# Patient Record
Sex: Female | Born: 1975 | Race: White | Hispanic: No | Marital: Married | Smoking: Never smoker
Health system: Southern US, Community
[De-identification: ages and names within clinical notes are randomized; demographics above are authoritative.]

## PROBLEM LIST (undated history)

## (undated) ENCOUNTER — Inpatient Hospital Stay (HOSPITAL_COMMUNITY): Payer: Self-pay

## (undated) DIAGNOSIS — IMO0001 Reserved for inherently not codable concepts without codable children: Secondary | ICD-10-CM

## (undated) DIAGNOSIS — R87629 Unspecified abnormal cytological findings in specimens from vagina: Secondary | ICD-10-CM

## (undated) DIAGNOSIS — Z789 Other specified health status: Secondary | ICD-10-CM

## (undated) DIAGNOSIS — O24419 Gestational diabetes mellitus in pregnancy, unspecified control: Secondary | ICD-10-CM

## (undated) DIAGNOSIS — IMO0002 Reserved for concepts with insufficient information to code with codable children: Secondary | ICD-10-CM

## (undated) DIAGNOSIS — R87619 Unspecified abnormal cytological findings in specimens from cervix uteri: Secondary | ICD-10-CM

## (undated) DIAGNOSIS — B009 Herpesviral infection, unspecified: Secondary | ICD-10-CM

## (undated) DIAGNOSIS — B029 Zoster without complications: Secondary | ICD-10-CM

## (undated) DIAGNOSIS — O894 Spinal and epidural anesthesia-induced headache during the puerperium: Secondary | ICD-10-CM

## (undated) DIAGNOSIS — D62 Acute posthemorrhagic anemia: Secondary | ICD-10-CM

## (undated) HISTORY — DX: Gestational diabetes mellitus in pregnancy, unspecified control: O24.419

## (undated) HISTORY — DX: Unspecified abnormal cytological findings in specimens from cervix uteri: R87.619

## (undated) HISTORY — PX: LEEP: SHX91

## (undated) HISTORY — DX: Reserved for concepts with insufficient information to code with codable children: IMO0002

## (undated) HISTORY — PX: WISDOM TOOTH EXTRACTION: SHX21

## (undated) HISTORY — DX: Zoster without complications: B02.9

## (undated) HISTORY — PX: TUBAL LIGATION: SHX77

## (undated) HISTORY — DX: Herpesviral infection, unspecified: B00.9

## (undated) HISTORY — DX: Unspecified abnormal cytological findings in specimens from vagina: R87.629

---

## 2004-07-01 ENCOUNTER — Encounter: Admission: RE | Admit: 2004-07-01 | Discharge: 2004-09-26 | Payer: Self-pay | Admitting: Anesthesiology

## 2004-07-02 ENCOUNTER — Ambulatory Visit: Payer: Self-pay | Admitting: Anesthesiology

## 2004-07-11 ENCOUNTER — Ambulatory Visit: Payer: Self-pay | Admitting: Physical Medicine & Rehabilitation

## 2004-08-06 ENCOUNTER — Ambulatory Visit: Payer: Self-pay | Admitting: Anesthesiology

## 2004-08-20 ENCOUNTER — Ambulatory Visit: Payer: Self-pay | Admitting: Anesthesiology

## 2012-09-24 HISTORY — PX: OTHER SURGICAL HISTORY: SHX169

## 2013-01-08 DIAGNOSIS — O039 Complete or unspecified spontaneous abortion without complication: Secondary | ICD-10-CM | POA: Insufficient documentation

## 2013-01-09 ENCOUNTER — Inpatient Hospital Stay (HOSPITAL_COMMUNITY)
Admission: AD | Admit: 2013-01-09 | Discharge: 2013-01-09 | Disposition: A | Discharge status: Home / Self Care | Payer: BC Managed Care – PPO | Source: Ambulatory Visit | Attending: Obstetrics & Gynecology | Admitting: Obstetrics & Gynecology

## 2013-01-09 ENCOUNTER — Encounter (HOSPITAL_COMMUNITY): Payer: Self-pay | Admitting: *Deleted

## 2013-01-09 DIAGNOSIS — O039 Complete or unspecified spontaneous abortion without complication: Secondary | ICD-10-CM

## 2013-01-09 HISTORY — DX: Other specified health status: Z78.9

## 2013-01-09 LAB — CBC WITH DIFFERENTIAL/PLATELET
Basophils Relative: 0 % (ref 0–1)
HCT: 34.7 % — ABNORMAL LOW (ref 36.0–46.0)
Hemoglobin: 12 g/dL (ref 12.0–15.0)
Lymphocytes Relative: 29 % (ref 12–46)
Lymphs Abs: 2 10*3/uL (ref 0.7–4.0)
MCHC: 34.6 g/dL (ref 30.0–36.0)
Monocytes Absolute: 0.6 10*3/uL (ref 0.1–1.0)
Monocytes Relative: 9 % (ref 3–12)
Neutro Abs: 4.2 10*3/uL (ref 1.7–7.7)
Neutrophils Relative %: 61 % (ref 43–77)
RBC: 3.95 MIL/uL (ref 3.87–5.11)
WBC: 6.8 10*3/uL (ref 4.0–10.5)

## 2013-01-09 NOTE — Progress Notes (Signed)
Dr Juliene Pina in and did pelvic exam.

## 2013-01-09 NOTE — Progress Notes (Signed)
Pt states does cont. To leak cl fld

## 2013-01-09 NOTE — MAU Provider Note (Signed)
  History    CSN: [id number] Arrival date and time: 01/08/13 2359 First Provider Initiated Contact with Patient 01/09/13 0215    Chief Complaint  Patient presents with  . Fever   HPI 37 yo, here with fever. 9 wk pregnancy, but low bHCG with thick endometrial tissue but no fetal pole, bHCG is dropping slowly, so Cytotec was started to assist with completion SAB.  Patient noted low grade fever on 4/11, before starting Cytotec but fever was 100.5 today since taking 2 doses of Cytotec (400 mcg q 4 hrs, last dose >5-6 hrs back). Other than fever, she has no other effects from Cytotec, denies bleeding or passing tissue. Noted mild cramps. Denies cough/ back or flank pain/ UTI symptoms/ abdominal pain with nausea/vomiting/ foul vaginal discharge. Sick contact with "stomach bug" but she denies GI symptoms.    Past Medical History  Diagnosis Date  . Medical history non-contributory     Past Surgical History  Procedure Laterality Date  . Tubal ligation    . Tubal reversal  09/24/2012  . Wisdom tooth extraction      Family History  Problem Relation Age of Onset  . Transient ischemic attack Mother   . Diabetes Father   . Heart disease Father   . Hypertension Father   . Heart disease Maternal Grandmother   . Diabetes Maternal Grandfather   . Diabetes Paternal Grandmother   . Emphysema Paternal Grandmother   . Cancer Paternal Grandfather     History  Substance Use Topics  . Smoking status: Never Smoker   . Smokeless tobacco: Former Neurosurgeon    Quit date: 09/23/2012  . Alcohol Use: Yes     Comment: very occ.    Allergies:  Allergies  Allergen Reactions  . Codeine Anaphylaxis  . Penicillins Anaphylaxis  . Sulfa Antibiotics Anaphylaxis  . Lyrica (Pregabalin) Other (See Comments)    hallucinations    Prescriptions prior to admission  Medication Sig Dispense Refill  . acetaminophen (TYLENOL) 500 MG tablet Take 500 mg by mouth every 6 (six) hours as needed for pain.      Marland Kitchen  ibuprofen (ADVIL,MOTRIN) 800 MG tablet Take 800 mg by mouth every 8 (eight) hours as needed for pain.      . misoprostol (CYTOTEC) 200 MCG tablet Take 400 mcg by mouth 4 (four) times daily.      . Prenatal Vit-Fe Fumarate-FA (PRENATAL MULTIVITAMIN) TABS Take 1 tablet by mouth daily at 12 noon.        ROS Physical Exam   Blood pressure 113/67, pulse 78, temperature 97.4 F (36.3 C), temperature source Oral, resp. rate 18, height 5\' 1"  (1.549 m), weight 143 lb 6.4 oz (65.046 kg), last menstrual period 11/02/2012, SpO2 98.00%.  Physical Exam A&O x 3, no acute distress. Pleasant, she feels warm although temp here is normal  Lungs CTA bilat CV RRR, S1S2 normal Abdo soft, non tender, non acute, no rebound/guarding Extr no edema/ tenderness Pelvic Uterus anteverted, normal size and non tender. No bleeding noted, cervix closed. No abnormal discharge.   MAU Course  Procedures CBC- normal, no elevated WBC or shift.   Assessment and Plan  Fever with normal exam. Likely started as viral infection before Cytotec but since then medication induced. No focus of infection.  Discharge home. Stop Cytotec since not helping at this point. Monitor for other symptoms. PLan to have sooner f/up and labs in office this week.   Zacharie Portner R 01/09/2013, 2:33 AM

## 2013-01-09 NOTE — Progress Notes (Signed)
Written and verbal d/c instructions given and understanding voiced. Will call office Monday morning for new appt

## 2013-01-09 NOTE — Progress Notes (Signed)
Written and verbal d/c instructions given and understanding voiced. 

## 2013-01-09 NOTE — Progress Notes (Signed)
Dr Juliene Pina notified of pt's admission and status. Labs ordered and will see pt

## 2013-01-09 NOTE — MAU Note (Signed)
My HCGs have been coming down so I know I'll have a miscarriage. Took cytotec 2 pills at 1000 and 1400. Have not had any bleeding or cramping. Tonight at 1900 felt like my water broke and had temp of 100.8. At 1955 temp 101.1. Took Tylenol at 2015. At 2200 temp 100.5.

## 2013-01-09 NOTE — Progress Notes (Signed)
Dr Juliene Pina notified of pt's lab results. Will see pt

## 2013-03-14 LAB — OB RESULTS CONSOLE HIV ANTIBODY (ROUTINE TESTING): HIV: NONREACTIVE

## 2013-03-14 LAB — OB RESULTS CONSOLE RPR: RPR: NONREACTIVE

## 2013-03-14 LAB — OB RESULTS CONSOLE HEPATITIS B SURFACE ANTIGEN: HEP B S AG: NEGATIVE

## 2013-03-14 LAB — OB RESULTS CONSOLE RUBELLA ANTIBODY, IGM: Rubella: UNDETERMINED

## 2013-03-14 LAB — OB RESULTS CONSOLE ABO/RH: RH Type: NEGATIVE

## 2013-03-14 LAB — OB RESULTS CONSOLE ANTIBODY SCREEN: ANTIBODY SCREEN: POSITIVE

## 2013-03-21 LAB — OB RESULTS CONSOLE GC/CHLAMYDIA
Chlamydia: NEGATIVE
GC PROBE AMP, GENITAL: NEGATIVE

## 2013-08-03 ENCOUNTER — Encounter: Payer: BC Managed Care – PPO | Attending: Obstetrics and Gynecology

## 2013-08-03 VITALS — Ht 61.0 in | Wt 168.6 lb

## 2013-08-03 DIAGNOSIS — Z713 Dietary counseling and surveillance: Secondary | ICD-10-CM | POA: Insufficient documentation

## 2013-08-03 DIAGNOSIS — O99810 Abnormal glucose complicating pregnancy: Secondary | ICD-10-CM

## 2013-08-08 ENCOUNTER — Other Ambulatory Visit: Payer: Self-pay | Admitting: *Deleted

## 2013-08-08 NOTE — Progress Notes (Signed)
  Patient was seen on 08/05/13 for Gestational Diabetes self-management class at the [facility]. The following learning objectives were met by the patient during this course:   States the definition of Gestational Diabetes  States why dietary management is important in controlling blood glucose  Describes the effects of carbohydrates on blood glucose levels  Demonstrates ability to create a balanced meal plan  Demonstrates carbohydrate counting   States when to check blood glucose levels  Demonstrates proper blood glucose monitoring techniques  States the effect of stress and exercise on blood glucose levels  States the importance of limiting caffeine and abstaining from alcohol and smoking  Plan:  Aim for 2 Carb Choices per meal (30 grams) +/- 1 either way for breakfast Aim for 3 Carb Choices per meal (45 grams) +/- 1 either way from lunch and dinner Aim for 1-2 Carbs per snack Begin reading food labels for Total Carbohydrate and sugar grams of foods Consider  increasing your activity level by walking daily as tolerated Begin checking BG before breakfast and 1-2 hours after first bit of breakfast, lunch and dinner after  as directed by MD  Take medication  as directed by MD  Blood glucose monitor given: None, already testing  Patient instructed to monitor glucose levels: FBS: 60 - <90 1 hour: <140 2 hour: <120  Patient received the following handouts:  Nutrition Diabetes and Pregnancy  Carbohydrate Counting List  Meal Planning worksheet  Patient will be seen for follow-up as needed.

## 2013-09-20 LAB — OB RESULTS CONSOLE GBS: STREP GROUP B AG: POSITIVE

## 2013-09-29 NOTE — L&D Delivery Note (Signed)
Delivery Note At 2:43 AM a viable and healthy female was delivered via Vaginal, Spontaneous Delivery (Presentation: ; Occiput Anterior).  APGAR: 7, 8; weight pending.   Placenta status: Intact, .  Cord:  with the following complications: none.  Cord pH: na  Anesthesia: Epidural  Episiotomy: None Lacerations: second Suture Repair: 2.0 vicryl rapide Est. Blood Loss (mL): 200  Mom to postpartum.  Baby to Couplet care / Skin to Skin.  Oktober Glazer J 10/10/2013, 2:57 AM

## 2013-09-29 NOTE — L&D Delivery Note (Deleted)
Delivery Note At  a viable and healthy female sex was delivered via  (Presentation:ROA ).  APGAR: 8, 9; weight pending .   Placenta status: spontaneous, intact.  Cord:  with the following complications: shoulder cord x one .  Cord pH: na  Anesthesia: Epidural  Episiotomy: none Lacerations: second Suture Repair: 2.0 vicryl rapide Est. Blood Loss (mL): 300  Mom to postpartum.  Baby to Couplet care / Skin to Skin.  Keidy Thurgood J 10/10/2013, 1:02 AM

## 2013-10-05 ENCOUNTER — Telehealth (HOSPITAL_COMMUNITY): Payer: Self-pay | Admitting: *Deleted

## 2013-10-05 ENCOUNTER — Encounter (HOSPITAL_COMMUNITY): Payer: Self-pay | Admitting: *Deleted

## 2013-10-05 ENCOUNTER — Other Ambulatory Visit: Payer: Self-pay | Admitting: Obstetrics and Gynecology

## 2013-10-05 NOTE — Telephone Encounter (Signed)
Preadmission screen  

## 2013-10-09 ENCOUNTER — Inpatient Hospital Stay (HOSPITAL_COMMUNITY): Payer: BC Managed Care – PPO | Admitting: Anesthesiology

## 2013-10-09 ENCOUNTER — Inpatient Hospital Stay (HOSPITAL_COMMUNITY)
Admission: RE | Admit: 2013-10-09 | Discharge: 2013-10-12 | DRG: 774 | Disposition: A | Discharge status: Home / Self Care | Payer: BC Managed Care – PPO | Source: Ambulatory Visit | Attending: Obstetrics and Gynecology | Admitting: Obstetrics and Gynecology

## 2013-10-09 ENCOUNTER — Encounter (HOSPITAL_COMMUNITY): Payer: Self-pay

## 2013-10-09 ENCOUNTER — Encounter (HOSPITAL_COMMUNITY): Payer: BC Managed Care – PPO | Admitting: Anesthesiology

## 2013-10-09 VITALS — BP 113/67 | HR 84 | Temp 98.5°F | Resp 18 | Ht 61.0 in | Wt 178.0 lb

## 2013-10-09 DIAGNOSIS — O360990 Maternal care for other rhesus isoimmunization, unspecified trimester, not applicable or unspecified: Secondary | ICD-10-CM | POA: Diagnosis present

## 2013-10-09 DIAGNOSIS — Z3490 Encounter for supervision of normal pregnancy, unspecified, unspecified trimester: Secondary | ICD-10-CM

## 2013-10-09 DIAGNOSIS — O9903 Anemia complicating the puerperium: Secondary | ICD-10-CM | POA: Diagnosis not present

## 2013-10-09 DIAGNOSIS — O9989 Other specified diseases and conditions complicating pregnancy, childbirth and the puerperium: Secondary | ICD-10-CM

## 2013-10-09 DIAGNOSIS — O09529 Supervision of elderly multigravida, unspecified trimester: Secondary | ICD-10-CM | POA: Diagnosis present

## 2013-10-09 DIAGNOSIS — Z22330 Carrier of Group B streptococcus: Secondary | ICD-10-CM

## 2013-10-09 DIAGNOSIS — IMO0002 Reserved for concepts with insufficient information to code with codable children: Secondary | ICD-10-CM | POA: Diagnosis not present

## 2013-10-09 DIAGNOSIS — O99892 Other specified diseases and conditions complicating childbirth: Secondary | ICD-10-CM | POA: Diagnosis present

## 2013-10-09 DIAGNOSIS — O99814 Abnormal glucose complicating childbirth: Principal | ICD-10-CM | POA: Diagnosis present

## 2013-10-09 DIAGNOSIS — O9913 Other diseases of the blood and blood-forming organs and certain disorders involving the immune mechanism complicating the puerperium: Secondary | ICD-10-CM

## 2013-10-09 DIAGNOSIS — O24419 Gestational diabetes mellitus in pregnancy, unspecified control: Secondary | ICD-10-CM | POA: Diagnosis present

## 2013-10-09 DIAGNOSIS — D62 Acute posthemorrhagic anemia: Secondary | ICD-10-CM | POA: Diagnosis not present

## 2013-10-09 DIAGNOSIS — D689 Coagulation defect, unspecified: Secondary | ICD-10-CM | POA: Diagnosis not present

## 2013-10-09 DIAGNOSIS — D696 Thrombocytopenia, unspecified: Secondary | ICD-10-CM | POA: Diagnosis present

## 2013-10-09 LAB — GLUCOSE, CAPILLARY
Glucose-Capillary: 91 mg/dL (ref 70–99)
Glucose-Capillary: 80 mg/dL (ref 70–99)

## 2013-10-09 LAB — CBC
HEMATOCRIT: 28.4 % — AB (ref 36.0–46.0)
HEMOGLOBIN: 9 g/dL — AB (ref 12.0–15.0)
MCH: 24.8 pg — ABNORMAL LOW (ref 26.0–34.0)
MCHC: 31.7 g/dL (ref 30.0–36.0)
MCV: 78.2 fL (ref 78.0–100.0)
Platelets: 126 10*3/uL — ABNORMAL LOW (ref 150–400)
RBC: 3.63 MIL/uL — ABNORMAL LOW (ref 3.87–5.11)
RDW: 16 % — ABNORMAL HIGH (ref 11.5–15.5)
WBC: 6.1 10*3/uL (ref 4.0–10.5)

## 2013-10-09 LAB — RPR: RPR: NONREACTIVE

## 2013-10-09 MED ORDER — CLINDAMYCIN PHOSPHATE 900 MG/50ML IV SOLN
900.0000 mg | Freq: Three times a day (TID) | INTRAVENOUS | Status: DC
  Administered 2013-10-09 (×3): 900 mg via INTRAVENOUS
  Filled 2013-10-09 (×5): qty 50

## 2013-10-09 MED ORDER — TERBUTALINE SULFATE 1 MG/ML IJ SOLN: 0.2500 mg | Freq: Once | INTRAMUSCULAR | Status: AC | PRN

## 2013-10-09 MED ORDER — PHENYLEPHRINE 40 MCG/ML (10ML) SYRINGE FOR IV PUSH (FOR BLOOD PRESSURE SUPPORT)
80.0000 ug | PREFILLED_SYRINGE | INTRAVENOUS | Status: DC | PRN
  Filled 2013-10-09: qty 2
  Filled 2013-10-09: qty 10

## 2013-10-09 MED ORDER — LIDOCAINE HCL (PF) 1 % IJ SOLN
INTRAMUSCULAR | Status: DC | PRN
  Administered 2013-10-09 (×4): 4 mL

## 2013-10-09 MED ORDER — EPHEDRINE 5 MG/ML INJ
10.0000 mg | INTRAVENOUS | Status: DC | PRN
  Filled 2013-10-09: qty 4
  Filled 2013-10-09: qty 2

## 2013-10-09 MED ORDER — CITRIC ACID-SODIUM CITRATE 334-500 MG/5ML PO SOLN: 30.0000 mL | ORAL | Status: DC | PRN

## 2013-10-09 MED ORDER — EPHEDRINE 5 MG/ML INJ
10.0000 mg | INTRAVENOUS | Status: DC | PRN
  Filled 2013-10-09: qty 2

## 2013-10-09 MED ORDER — FENTANYL 2.5 MCG/ML BUPIVACAINE 1/10 % EPIDURAL INFUSION (WH - ANES)
14.0000 mL/h | INTRAMUSCULAR | Status: DC | PRN
  Administered 2013-10-09: 14 mL/h via EPIDURAL
  Filled 2013-10-09: qty 125

## 2013-10-09 MED ORDER — LACTATED RINGERS IV SOLN
500.0000 mL | Freq: Once | INTRAVENOUS | Status: DC
Start: 2013-10-09 — End: 2013-10-10

## 2013-10-09 MED ORDER — OXYTOCIN 40 UNITS IN LACTATED RINGERS INFUSION - SIMPLE MED
62.5000 mL/h | INTRAVENOUS | Status: DC
  Administered 2013-10-10: 62.5 mL/h via INTRAVENOUS

## 2013-10-09 MED ORDER — DIPHENHYDRAMINE HCL 50 MG/ML IJ SOLN: 12.5000 mg | INTRAMUSCULAR | Status: DC | PRN

## 2013-10-09 MED ORDER — PHENYLEPHRINE 40 MCG/ML (10ML) SYRINGE FOR IV PUSH (FOR BLOOD PRESSURE SUPPORT)
80.0000 ug | PREFILLED_SYRINGE | INTRAVENOUS | Status: DC | PRN
  Filled 2013-10-09: qty 2

## 2013-10-09 MED ORDER — ACETAMINOPHEN 325 MG PO TABS: 650.0000 mg | ORAL_TABLET | ORAL | Status: DC | PRN

## 2013-10-09 MED ORDER — FLEET ENEMA 7-19 GM/118ML RE ENEM: 1.0000 | ENEMA | RECTAL | Status: DC | PRN

## 2013-10-09 MED ORDER — OXYTOCIN BOLUS FROM INFUSION
500.0000 mL | INTRAVENOUS | Status: DC
  Administered 2013-10-10: 500 mL via INTRAVENOUS

## 2013-10-09 MED ORDER — LACTATED RINGERS IV SOLN
500.0000 mL | INTRAVENOUS | Status: DC | PRN
  Administered 2013-10-09 (×3): 500 mL via INTRAVENOUS
  Administered 2013-10-10: 200 mL via INTRAVENOUS

## 2013-10-09 MED ORDER — MISOPROSTOL 25 MCG QUARTER TABLET
25.0000 ug | ORAL_TABLET | ORAL | Status: DC | PRN
Start: 2013-10-09 — End: 2013-10-10
  Administered 2013-10-09 (×2): 25 ug via VAGINAL
  Filled 2013-10-09: qty 0.25
  Filled 2013-10-09: qty 1
  Filled 2013-10-09 (×2): qty 0.25

## 2013-10-09 MED ORDER — OXYTOCIN 40 UNITS IN LACTATED RINGERS INFUSION - SIMPLE MED
1.0000 m[IU]/min | INTRAVENOUS | Status: DC
  Filled 2013-10-09: qty 1000

## 2013-10-09 MED ORDER — ONDANSETRON HCL 4 MG/2ML IJ SOLN
4.0000 mg | Freq: Four times a day (QID) | INTRAMUSCULAR | Status: DC | PRN
  Administered 2013-10-10: 4 mg via INTRAVENOUS
  Filled 2013-10-09: qty 2

## 2013-10-09 MED ORDER — LIDOCAINE HCL (PF) 1 % IJ SOLN
30.0000 mL | INTRAMUSCULAR | Status: DC | PRN
  Filled 2013-10-09 (×2): qty 30

## 2013-10-09 MED ORDER — IBUPROFEN 600 MG PO TABS: 600.0000 mg | ORAL_TABLET | Freq: Four times a day (QID) | ORAL | Status: DC | PRN

## 2013-10-09 MED ORDER — LACTATED RINGERS IV SOLN
INTRAVENOUS | Status: DC
  Administered 2013-10-09: 300 mL via INTRAUTERINE

## 2013-10-09 MED ORDER — LACTATED RINGERS IV SOLN
INTRAVENOUS | Status: DC
  Administered 2013-10-09 (×3): via INTRAVENOUS

## 2013-10-09 NOTE — Anesthesia Preprocedure Evaluation (Signed)
Anesthesia Evaluation  Patient identified by MRN, date of birth, ID band Patient awake    Reviewed: Allergy & Precautions, H&P , NPO status , Patient's Chart, lab work & pertinent test results, reviewed documented beta blocker date and time   History of Anesthesia Complications Negative for: history of anesthetic complications  Airway Mallampati: II TM Distance: >3 FB Neck ROM: full    Dental  (+) Teeth Intact   Pulmonary neg pulmonary ROS,  breath sounds clear to auscultation        Cardiovascular negative cardio ROS  Rhythm:regular Rate:Normal     Neuro/Psych negative neurological ROS  negative psych ROS   GI/Hepatic negative GI ROS, Neg liver ROS,   Endo/Other  diabetes, Gestational, Oral Hypoglycemic AgentsBMI 34  Renal/GU negative Renal ROS  negative genitourinary   Musculoskeletal   Abdominal   Peds  Hematology  (+) anemia , Thrombocytopenia - plt 112   Anesthesia Other Findings   Reproductive/Obstetrics (+) Pregnancy                           Anesthesia Physical Anesthesia Plan  ASA: III  Anesthesia Plan: Epidural   Post-op Pain Management:    Induction:   Airway Management Planned:   Additional Equipment:   Intra-op Plan:   Post-operative Plan:   Informed Consent: I have reviewed the patients History and Physical, chart, labs and discussed the procedure including the risks, benefits and alternatives for the proposed anesthesia with the patient or authorized representative who has indicated his/her understanding and acceptance.     Plan Discussed with:   Anesthesia Plan Comments:         Anesthesia Quick Evaluation

## 2013-10-09 NOTE — Progress Notes (Signed)
Stacey Proctor is a 38 y.o. Z6X0960G4P2012 at 4269w6d by LMP admitted for induction of labor due to Diabetes.  Subjective: Feels crampy Good FM  Objective: BP 137/69  Pulse 70  Temp(Src) 97.4 F (36.3 C) (Oral)  Resp 18  Ht 5\' 1"  (1.549 m)  Wt 80.74 kg (178 lb)  BMI 33.65 kg/m2  LMP 01/12/2013      FHT:  FHR: 145 bpm, variability: moderate,  accelerations:  Present,  decelerations:  Absent UC:   irregular, every 2-7 minutes SVE:   Dilation: 1 Effacement (%): 50;60 Station: -3 Exam by:: Stacey SlimH. Mitchell RN Cytotec placed at 1300  Labs: Lab Results  Component Value Date   WBC 6.1 10/09/2013   HGB 9.0* 10/09/2013   HCT 28.4* 10/09/2013   MCV 78.2 10/09/2013   PLT 126* 10/09/2013    Assessment / Plan: Class A2 DM with poor compliance Gestational Anemia  Labor: Progressing normally Preeclampsia:  no signs or symptoms of toxicity Fetal Wellbeing:  Category I Pain Control:  Labor support without medications I/D:  n/a Anticipated MOD:  NSVD  Stacey Proctor 10/09/2013, 4:52 PM

## 2013-10-09 NOTE — Anesthesia Procedure Notes (Signed)
Epidural Patient location during procedure: OB Start time: 10/09/2013 8:57 PM  Staffing Performed by: anesthesiologist   Preanesthetic Checklist Completed: patient identified, site marked, surgical consent, pre-op evaluation, timeout performed, IV checked, risks and benefits discussed and monitors and equipment checked  Epidural Patient position: sitting Prep: site prepped and draped and DuraPrep Patient monitoring: continuous pulse ox and blood pressure Approach: midline Injection technique: LOR air  Needle:  Needle type: Tuohy  Needle gauge: 17 G Needle length: 9 cm and 9 Needle insertion depth: 5 cm cm Catheter type: closed end flexible Catheter size: 19 Gauge Catheter at skin depth: 10 cm Test dose: negative  Assessment Events: blood not aspirated, injection not painful, no injection resistance, negative IV test and no paresthesia  Additional Notes Discussed risk of headache, infection, bleeding, nerve injury and failed or incomplete block.  Patient voices understanding and wishes to proceed.  Epidural placed easily on first attempt.  No paresthesia.  Patient tolerated procedure well with no apparent complications.  Stacey Proctor, MDReason for block:procedure for pain

## 2013-10-09 NOTE — Progress Notes (Signed)
Stacey SickleJudy Proctor is a 38 y.o. Z6X0960G4P2012 at 4835w6d by LMP admitted for induction of labor due to Diabetes.  Subjective: Feels crampy  Objective: BP 119/64  Pulse 67  Temp(Src) 97.4 F (36.3 C) (Oral)  Resp 18  Ht 5\' 1"  (1.549 m)  Wt 80.74 kg (178 lb)  BMI 33.65 kg/m2  LMP 01/12/2013      FHT:  FHR: 155 bpm, variability: moderate,  accelerations:  Present,  decelerations:  Present intermittent variables, occ late UC:   irregular, every 2  minutes SVE:   2-3 /50/-2 AROM- clear IUPC placed without difficulty  Labs: Lab Results  Component Value Date   WBC 6.1 10/09/2013   HGB 9.0* 10/09/2013   HCT 28.4* 10/09/2013   MCV 78.2 10/09/2013   PLT 126* 10/09/2013    Assessment / Plan: Class A2 DM Intermittent variable deces- ? tachysystole pattern post cytotec  Labor: Progressing Preeclampsia:  no signs or symptoms of toxicity Fetal Wellbeing:  Category I and Category II Pain Control:  Labor support without medications I/D:  n/a Anticipated MOD:  NSVD  Jarman Litton J 10/09/2013, 6:22 PM

## 2013-10-09 NOTE — Progress Notes (Signed)
Stacey SickleJudy Proctor is a 38 y.o. Z6X0960G4P2012 at 4100w6d by LMP admitted for induction of labor due to Diabetes.  Subjective: comfortable  Objective: BP 127/68  Pulse 75  Temp(Src) 97.8 F (36.6 C) (Oral)  Resp 18  Ht 5\' 1"  (1.549 m)  Wt 80.74 kg (178 lb)  BMI 33.65 kg/m2  LMP 01/12/2013      FHT:  FHR: 145 bpm, variability: moderate,  accelerations:  Present,  decelerations:  Absent UC:   irregular, every 10 minutes SVE:   Dilation: 1 Effacement (%): 60 Station: Ballotable Exam by:: D. brown RNC  Labs: Lab Results  Component Value Date   WBC 6.1 10/09/2013   HGB 9.0* 10/09/2013   HCT 28.4* 10/09/2013   MCV 78.2 10/09/2013   PLT 126* 10/09/2013    Assessment / Plan: Class A2 DM on cytotec  Labor: Progressing normally Preeclampsia:  no signs or symptoms of toxicity Fetal Wellbeing:  Category I Pain Control:  Labor support without medications I/D:  n/a Anticipated MOD:  NSVD  Stacey Proctor Stacey Proctor 10/09/2013, 11:50 AM

## 2013-10-09 NOTE — Progress Notes (Signed)
Stacey SickleJudy Proctor is a 38 y.o. Z6X0960G4P2012 at 2769w6d by LMP admitted for induction of labor due to Diabetes.  Subjective: Contractions getting stronger  Objective: BP 131/82  Pulse 74  Temp(Src) 98.3 F (36.8 C) (Oral)  Resp 18  Ht 5\' 1"  (1.549 m)  Wt 80.74 kg (178 lb)  BMI 33.65 kg/m2  LMP 01/12/2013      FHT:  FHR: 155 bpm, variability: moderate,  accelerations:  Present,  decelerations:  Present intermittent variables UC:   regular, every 2 minutes SVE:   Dilation: 3 Effacement (%): 60;70 Station: -2 Exam by:: Ace GinsL. Cresenzo, RN  Labs: Lab Results  Component Value Date   WBC 6.8 10/09/2013   HGB 8.6* 10/09/2013   HCT 27.0* 10/09/2013   MCV 78.3 10/09/2013   PLT 112* 10/09/2013    Assessment / Plan: Class A2 DM Latent phase with adequate contractions. Improved FHR tracing  Labor: Progressing normally Preeclampsia:  no signs or symptoms of toxicity Fetal Wellbeing:  Category I Pain Control:  Labor support without medications I/D:  n/a Anticipated MOD:  NSVD  Delyle Weider J 10/09/2013, 8:00 PM

## 2013-10-09 NOTE — Progress Notes (Signed)
Towel changed on patient and noted to be saturated with bright red blood with a few clots. Dr Billy Coastaavon made aware along with FHR, contraction pattern, vaginal exam and MVU's. No orders given. Will continue to monitor.

## 2013-10-10 ENCOUNTER — Encounter (HOSPITAL_COMMUNITY): Payer: Self-pay

## 2013-10-10 LAB — CBC
HCT: 27 % — ABNORMAL LOW (ref 36.0–46.0)
HCT: 26.3 % — ABNORMAL LOW (ref 36.0–46.0)
HCT: 24.9 % — ABNORMAL LOW (ref 36.0–46.0)
HEMOGLOBIN: 8.6 g/dL — AB (ref 12.0–15.0)
HEMOGLOBIN: 8.1 g/dL — AB (ref 12.0–15.0)
Hemoglobin: 8.5 g/dL — ABNORMAL LOW (ref 12.0–15.0)
MCH: 24.9 pg — AB (ref 26.0–34.0)
MCH: 25.1 pg — ABNORMAL LOW (ref 26.0–34.0)
MCH: 25.2 pg — AB (ref 26.0–34.0)
MCHC: 31.9 g/dL (ref 30.0–36.0)
MCHC: 32.3 g/dL (ref 30.0–36.0)
MCHC: 32.5 g/dL (ref 30.0–36.0)
MCV: 78.3 fL (ref 78.0–100.0)
MCV: 77.8 fL — ABNORMAL LOW (ref 78.0–100.0)
MCV: 77.3 fL — AB (ref 78.0–100.0)
PLATELETS: 108 10*3/uL — AB (ref 150–400)
Platelets: 112 10*3/uL — ABNORMAL LOW (ref 150–400)
Platelets: 102 10*3/uL — ABNORMAL LOW (ref 150–400)
RBC: 3.45 MIL/uL — ABNORMAL LOW (ref 3.87–5.11)
RBC: 3.38 MIL/uL — AB (ref 3.87–5.11)
RBC: 3.22 MIL/uL — ABNORMAL LOW (ref 3.87–5.11)
RDW: 15.9 % — ABNORMAL HIGH (ref 11.5–15.5)
RDW: 15.9 % — ABNORMAL HIGH (ref 11.5–15.5)
RDW: 16.1 % — ABNORMAL HIGH (ref 11.5–15.5)
WBC: 6.8 10*3/uL (ref 4.0–10.5)
WBC: 12 10*3/uL — AB (ref 4.0–10.5)
WBC: 12 10*3/uL — ABNORMAL HIGH (ref 4.0–10.5)

## 2013-10-10 LAB — GLUCOSE, CAPILLARY
Glucose-Capillary: 93 mg/dL (ref 70–99)
Glucose-Capillary: 94 mg/dL (ref 70–99)

## 2013-10-10 MED ORDER — SENNOSIDES-DOCUSATE SODIUM 8.6-50 MG PO TABS
2.0000 | ORAL_TABLET | ORAL | Status: DC
  Administered 2013-10-11 (×2): 2 via ORAL
  Filled 2013-10-10 (×2): qty 2

## 2013-10-10 MED ORDER — METHYLERGONOVINE MALEATE 0.2 MG PO TABS: 0.2000 mg | ORAL_TABLET | ORAL | Status: DC | PRN

## 2013-10-10 MED ORDER — TETANUS-DIPHTH-ACELL PERTUSSIS 5-2.5-18.5 LF-MCG/0.5 IM SUSP: 0.5000 mL | Freq: Once | INTRAMUSCULAR | Status: DC

## 2013-10-10 MED ORDER — DIBUCAINE 1 % RE OINT: 1.0000 "application " | TOPICAL_OINTMENT | RECTAL | Status: DC | PRN

## 2013-10-10 MED ORDER — SIMETHICONE 80 MG PO CHEW: 80.0000 mg | CHEWABLE_TABLET | ORAL | Status: DC | PRN

## 2013-10-10 MED ORDER — ACETAMINOPHEN 500 MG PO TABS
1000.0000 mg | ORAL_TABLET | Freq: Three times a day (TID) | ORAL | Status: DC | PRN
  Administered 2013-10-10 – 2013-10-11 (×2): 1000 mg via ORAL
  Filled 2013-10-10 (×2): qty 2

## 2013-10-10 MED ORDER — OXYCODONE-ACETAMINOPHEN 5-325 MG PO TABS
1.0000 | ORAL_TABLET | ORAL | Status: DC | PRN
  Administered 2013-10-10 – 2013-10-12 (×4): 1 via ORAL
  Filled 2013-10-10 (×4): qty 1

## 2013-10-10 MED ORDER — ZOLPIDEM TARTRATE 5 MG PO TABS: 5.0000 mg | ORAL_TABLET | Freq: Every evening | ORAL | Status: DC | PRN

## 2013-10-10 MED ORDER — LANOLIN HYDROUS EX OINT: TOPICAL_OINTMENT | CUTANEOUS | Status: DC | PRN

## 2013-10-10 MED ORDER — PRENATAL MULTIVITAMIN CH
1.0000 | ORAL_TABLET | Freq: Every day | ORAL | Status: DC
Start: 2013-10-10 — End: 2013-10-12
  Administered 2013-10-10 – 2013-10-11 (×2): 1 via ORAL
  Filled 2013-10-10 (×2): qty 1

## 2013-10-10 MED ORDER — METHYLERGONOVINE MALEATE 0.2 MG/ML IJ SOLN: 0.2000 mg | INTRAMUSCULAR | Status: DC | PRN

## 2013-10-10 MED ORDER — ONDANSETRON HCL 4 MG PO TABS
4.0000 mg | ORAL_TABLET | ORAL | Status: DC | PRN
Start: 2013-10-10 — End: 2013-10-12

## 2013-10-10 MED ORDER — IBUPROFEN 600 MG PO TABS
600.0000 mg | ORAL_TABLET | Freq: Four times a day (QID) | ORAL | Status: DC
  Administered 2013-10-10: 600 mg via ORAL
  Filled 2013-10-10 (×2): qty 1

## 2013-10-10 MED ORDER — DIPHENHYDRAMINE HCL 25 MG PO CAPS: 25.0000 mg | ORAL_CAPSULE | Freq: Four times a day (QID) | ORAL | Status: DC | PRN

## 2013-10-10 MED ORDER — POLYSACCHARIDE IRON COMPLEX 150 MG PO CAPS
150.0000 mg | ORAL_CAPSULE | Freq: Two times a day (BID) | ORAL | Status: DC
  Administered 2013-10-10 – 2013-10-12 (×5): 150 mg via ORAL
  Filled 2013-10-10 (×5): qty 1

## 2013-10-10 MED ORDER — BENZOCAINE-MENTHOL 20-0.5 % EX AERO: 1.0000 "application " | INHALATION_SPRAY | CUTANEOUS | Status: DC | PRN

## 2013-10-10 MED ORDER — WITCH HAZEL-GLYCERIN EX PADS: 1.0000 "application " | MEDICATED_PAD | CUTANEOUS | Status: DC | PRN

## 2013-10-10 MED ORDER — ONDANSETRON HCL 4 MG/2ML IJ SOLN: 4.0000 mg | INTRAMUSCULAR | Status: DC | PRN

## 2013-10-10 NOTE — H&P (Signed)
NAME:  Celso SickleHIATT, Janaisha                  ACCOUNT NO.:  [id number]  MEDICAL RECORD NO.:  [id number]  LOCATION:  9160                          FACILITY:  [facility]  PHYSICIAN:  Lenoard Adenichard J. Hilding Quintanar, M.D.DATE OF BIRTH:  Aug 13, 1976  DATE OF ADMISSION:  10/09/2013 DATE OF DISCHARGE:                             HISTORY & PHYSICAL   CHIEF COMPLAINT:  Poorly controlled class A2 diabetes mellitus at 39 weeks for induction.  HISTORY OF PRESENT ILLNESS:  She is a 38 year old white female, G3, P2, with a history of pregnancy induced diabetes poorly controlled on glyburide who presents now at 39 weeks for induction.  ALLERGIES:  TO CODEINE, PENICILLIN, SULFA, PENICILLIN, AND LYRICA.  SOCIAL HISTORY:  She is a nonsmoker, nondrinker.  She denies domestic or physical violence.  She has a history of obstetrical history remarkable for vaginal delivery x2 and history of a LEEP.  FAMILY HISTORY:  She has a family history of myocardial infarction, diabetes, hypertension.  MEDICATIONS:  Valtrex for a history of HSV.  She has a history of a tubal ligation with reversal in December of 2013.  PHYSICAL EXAMINATION:  GENERAL:  She is a well-developed, well-nourished white female, in no acute distress. HEENT:  Normal. NECK:  Supple.  Full range of motion. LUNGS:  Clear. HEART:  Regular rate and rhythm. ABDOMEN:  Soft, gravid, nontender.  Estimated fetal weight 770.5 pounds. Cervix is 1-2 cm thick, vertex -2. EXTREMITIES:  There are no cords. NEUROLOGIC:  Nonfocal. SKIN:  Intact.  IMPRESSION:  Class A2 diabetes mellitus at 39 weeks for cervical ripening induction.  PLAN:  Cytotec placed, NST reactive.  Anticipate cautious approach to vaginal delivery.  Check blood sugars q.6 hours __________.     Lenoard Adenichard J. Iran Rowe, M.D.     RJT/MEDQ  D:  10/09/2013  T:  10/10/2013  Job:  [id number]

## 2013-10-10 NOTE — Progress Notes (Signed)
Celso SickleJudy Rost is a 38 y.o. Z6X0960G4P2012 at 671w0d by LMP admitted for induction of labor due to Diabetes.  Subjective: comfortable  Objective: BP 144/81  Pulse 95  Temp(Src) 97.9 F (36.6 C) (Oral)  Resp 18  Ht 5\' 1"  (1.549 m)  Wt 80.74 kg (178 lb)  BMI 33.65 kg/m2  SpO2 100%  LMP 01/12/2013      FHT:  FHR: 145 bpm, variability: moderate,  accelerations:  Present,  decelerations:  Present occ mild variable decel UC:   regular, every 2 minutes SVE:   Dilation: 8 Effacement (%): 90 Station: 0 Exam by:: S Nix RN 200-250 MVU by IUPC  Labs: Lab Results  Component Value Date   WBC 6.8 10/09/2013   HGB 8.6* 10/09/2013   HCT 27.0* 10/09/2013   MCV 78.3 10/09/2013   PLT 112* 10/09/2013    Assessment / Plan: Class A2 DM H/O LEEP  Labor: Progressing normally Preeclampsia:  no signs or symptoms of toxicity Fetal Wellbeing:  Category I Pain Control:  Epidural I/D:  n/a Anticipated MOD:  NSVD  Prateek Knipple J 10/10/2013, 1:08 AM

## 2013-10-10 NOTE — Progress Notes (Addendum)
Patient ID: Celso SickleJudy Larmer, female   DOB: [birth year], 38 y.o.   MRN: [id number] INTERVAL NOTE:  S:   Sitting in bed, infant skin-to-skin, no complaints with breastfeeding, som mild nipple pain - applying lanolin cream, min cramping,  (+) voids, small bleed, denies HA/NV/dizziness  O:   VSS, AAO x 3, NAD  Fundus @ U  Scant lochia  A / P:   PPD #0  IDA Anemia  Start Niferex 150 mg BID  Stable post partum  Routine PP orders  Kenard GowerAWSON, Min [place], M, MSN, CNM 10/10/2013, 10:45 AM

## 2013-10-10 NOTE — Anesthesia Postprocedure Evaluation (Signed)
Anesthesia Post Note  Patient: Stacey Proctor  Procedure(s) Performed: * No procedures listed *  Anesthesia type: Epidural  Patient location: Mother/Baby  Post pain: Pain level controlled  Post assessment: Post-op Vital signs reviewed  Last Vitals:  Filed Vitals:   10/10/13 1014  BP: 113/74  Pulse: 70  Temp: 36.5 C  Resp: 20    Post vital signs: Reviewed  Level of consciousness:alert  Complications: No apparent anesthesia complications

## 2013-10-10 NOTE — Lactation Note (Signed)
This note was copied from the chart of Stacey Celso SickleJudy Carrigan. Lactation Consultation Note: Initial visit with mom. Baby now 609 hours old. Mom reports that he has latched a couple of times and nursed for 30 minutes each time. LS 8-9 by RN. No questions at present. BF brochure given with resources for support after DC. To call for assist prn  Patient Name: Stacey Proctor [id number]'XToday's Date: 10/10/2013 Reason for consult: Initial assessment   Maternal Data Formula Feeding for Exclusion: No Infant to breast within first hour of birth: Yes Does the patient have breastfeeding experience prior to this delivery?: Yes  Feeding Feeding Type: Breast Fed Length of feed: 30 min  LATCH Score/Interventions Latch: Grasps breast easily, tongue down, lips flanged, rhythmical sucking.  Audible Swallowing: A few with stimulation Intervention(s): Skin to skin  Type of Nipple: Everted at rest and after stimulation  Comfort (Breast/Nipple): Soft / non-tender     Hold (Positioning): Assistance needed to correctly position infant at breast and maintain latch.  LATCH Score: 8  Lactation Tools Discussed/Used     Consult Status Consult Status: Follow-up Date: 10/11/13 Follow-up type: In-patient    Pamelia HoitWeeks, Anisa Leanos D 10/10/2013, 11:54 AM

## 2013-10-10 NOTE — Lactation Note (Signed)
This note was copied from the chart of Stacey Celso SickleJudy Coulon. Lactation Consultation Note MBU RN requesting comfort gels for mom due to sore nipples.   MBU RN reports improved latch, but nipple still sore.  MBU RN to give comfort gels.   Patient Name: Stacey Proctor: 10/10/2013     Maternal Data    Feeding Feeding Type: Breast Fed Length of feed: 30 min  LATCH Score/Interventions Latch: Repeated attempts needed to sustain latch, nipple held in mouth throughout feeding, stimulation needed to elicit sucking reflex. Intervention(s): Skin to skin  Audible Swallowing: A few with stimulation Intervention(s): Skin to skin  Type of Nipple: Everted at rest and after stimulation  Comfort (Breast/Nipple): Filling, red/small blisters or bruises, mild/mod discomfort  Problem noted: Mild/Moderate discomfort  Hold (Positioning): No assistance needed to correctly position infant at breast.  LATCH Score: 7  Lactation Tools Discussed/Used     Consult Status      Stacey Proctor, Stacey Proctor 10/10/2013, 11:38 PM

## 2013-10-11 LAB — CBC
HEMATOCRIT: 23 % — AB (ref 36.0–46.0)
HEMOGLOBIN: 7.4 g/dL — AB (ref 12.0–15.0)
MCH: 25.4 pg — ABNORMAL LOW (ref 26.0–34.0)
MCHC: 32.2 g/dL (ref 30.0–36.0)
MCV: 79 fL (ref 78.0–100.0)
Platelets: 88 10*3/uL — ABNORMAL LOW (ref 150–400)
RBC: 2.91 MIL/uL — ABNORMAL LOW (ref 3.87–5.11)
RDW: 16.6 % — ABNORMAL HIGH (ref 11.5–15.5)
WBC: 7.8 10*3/uL (ref 4.0–10.5)

## 2013-10-11 NOTE — Progress Notes (Addendum)
PPD #1- SVD  Subjective:   Reports feeling well Tolerating po/ No nausea or vomiting No SOB, CP, or dizziness Bleeding is light Pain controlled with Percocet Up ad lib / ambulatory / voiding without problems Newborn: breastfeeding  / Circumcision: done   Objective:   VS:  VS:  Filed Vitals:   10/10/13 0625 10/10/13 1014 10/10/13 1818 10/11/13 0556  BP: 114/59 113/74 115/80 92/59  Pulse: 71 70 82 74  Temp: 98.1 F (36.7 C) 97.7 F (36.5 C) 98.3 F (36.8 C) 97.9 F (36.6 C)  TempSrc: Oral Oral Oral Oral  Resp: 20 20 18 20   Height:      Weight:      SpO2:        LABS:  Recent Labs  10/10/13 0610 10/11/13 0530  WBC 12.0* 7.8  HGB 8.1* 7.4*  PLT 102* 88*   Blood type: --/--/O NEG (01/11 0830) Rubella: Equivocal (06/16 0000)   I&O: Intake/Output     01/12 0701 - 01/13 0700 01/13 0701 - 01/14 0700   Blood     Total Output       Net              Physical Exam: Alert and oriented x3 Heart: RRR Lungs: CTA bilaterally Abdomen: soft, non-tender, non-distended  Fundus: firm, non-tender, U-1 Perineum: Well approximated, no significant erythema, edema, or drainage; healing well. Lochia: small Extremities: 1+ edema BLE, no calf pain or tenderness    Assessment:  PPD # 1G4P3013/ S/P:induced vaginal, 2nd degree laceration IDA with compounding ABL anemia-asymptomatic Thrombocytopenia A2GDM, delivered  Rh negative Doing well    Plan: Continue routine post partum orders Continue Fe bid Recheck CBC in am Percocet prn pain Anticipate D/C home tomorrow   Donette LarryBHAMBRI, Celestino Ackerman, N MSN, CNM 10/11/2013, 10:40 AM

## 2013-10-11 NOTE — Lactation Note (Signed)
This note was copied from the chart of Stacey Celso SickleJudy Barlowe. Lactation Consultation Note  Patient Name: Stacey Proctor OZHYQ'MToday's Date: 10/11/2013  Reason for consult: Follow-up assessment;Breast/nipple pain now resolving, per mom.  LC observes mom latch baby easily to (R) breast in cradle hold and frequent swallows heard soon after latch.  Mom using comfort gelpads to soothe her nipples between feedings and reports baby latching better and having some frequent cluster feedings this evening.  LC encouraged continued cue feedings and nipple care with expressed milk to moisten nipples before and after nursing and comfort gelpads between feedings.  Baby has had 10 feedings since midnight and output wnl.   Maternal Data    Feeding Feeding Type: Breast Fed Length of feed: 30 min  LATCH Score/Interventions Latch: Grasps breast easily, tongue down, lips flanged, rhythmical sucking.  Audible Swallowing: Spontaneous and intermittent  Type of Nipple: Everted at rest and after stimulation  Comfort (Breast/Nipple): Soft / non-tender     Hold (Positioning): No assistance needed to correctly position infant at breast.  LATCH Score: 10  (oberved by [facility])  Lactation Tools Discussed/Used   Cue feedings Nipple care  Consult Status Consult Status: Follow-up Date: 10/12/13 Follow-up type: In-patient    Warrick ParisianBryant, Cynthia [name] [facility] 10/11/2013, 10:12 PM

## 2013-10-12 LAB — CBC
HCT: 24.2 % — ABNORMAL LOW (ref 36.0–46.0)
Hemoglobin: 7.4 g/dL — ABNORMAL LOW (ref 12.0–15.0)
MCH: 24.7 pg — AB (ref 26.0–34.0)
MCHC: 30.6 g/dL (ref 30.0–36.0)
MCV: 80.9 fL (ref 78.0–100.0)
PLATELETS: 99 10*3/uL — AB (ref 150–400)
RBC: 2.99 MIL/uL — AB (ref 3.87–5.11)
RDW: 16.7 % — ABNORMAL HIGH (ref 11.5–15.5)
WBC: 7.6 10*3/uL (ref 4.0–10.5)

## 2013-10-12 MED ORDER — OXYCODONE-ACETAMINOPHEN 5-325 MG PO TABS: 1.0000 | ORAL_TABLET | ORAL | Status: DC | PRN

## 2013-10-12 MED ORDER — POLYSACCHARIDE IRON COMPLEX 150 MG PO CAPS: 150.0000 mg | ORAL_CAPSULE | Freq: Two times a day (BID) | ORAL | Status: DC

## 2013-10-12 NOTE — Progress Notes (Signed)
PPD 2 SVD  S:  Reports feeling well - request fluid pill for swelling / needs note for family court / needs disability papers filled             Concerned about bladder numbness - questions if will get feeling back now                                                 (present since tubal reversal in 2013)             Tolerating po/ No nausea or vomiting             Bleeding is light             Pain controlled with percocet only             Up ad lib / ambulatory / voiding QS  Newborn breast feeding    O:               VS: BP 113/67  Pulse 84  Temp(Src) 98.5 F (36.9 C) (Oral)  Resp 18  Ht 5\' 1"  (1.549 m)  Wt 178 lb (80.74 kg)  BMI 33.65 kg/m2  SpO2 100%  LMP 01/12/2013   LABS:              Recent Labs  10/11/13 0530 10/12/13 0705  WBC 7.8 7.6  HGB 7.4* 7.4*  PLT 88* 99*               Blood type: --/--/O NEG (01/11 0830)  Rubella: Equivocal (06/16 0000)                               Physical Exam:             Alert and oriented X3  Lungs: Clear and unlabored  Heart: regular rate and rhythm / no mumurs  Abdomen: soft, non-tender, non-distended              Fundus: firm, non-tender, U-1  Perineum: no edema  Lochia: light  Extremities: no edema, no calf pain or tenderness    A: PPD # 2              GDMa2 - not well-controlled             IDA compounded by ABL             Thrombocytopenia - improving platelet (99 today)             Dependent edema  Doing well - stable status  P: Routine post partum orders  Reviewed normal edema - increase water / elevate feet / will give small 3 day dose diuretic             Cautioned need to maintain good water hydration with low blood count to prevent dizziness             Iron for 6 weeks DAILY with PNV - recheck CBC and ferritin at postpartum exam             GTT 6-12 weeks  Stacey Proctor, Stacey Proctor CNM, MSN, [facility] 10/12/2013, 9:37 AM

## 2013-10-12 NOTE — Discharge Summary (Signed)
POSTOPERATIVE DISCHARGE SUMMARY:  Patient ID: Stacey Proctor MRN: [id number] DOB/AGE: 37-Oct-1977 37 y.o.  Admit date: 10/09/2013 Admission Diagnoses: 34 weeks / poorly controlled GDMa2 / AMA / IDA of pregnancy  Discharge date:  10/13/2013 Discharge Diagnoses: PPD 2 s/p SVD / IDA compounded with ABL anemia - stable / dependent edema  Prenatal history: [id number]   EDC : 10/17/2013, by Other Basis  Prenatal care at [facility]  Primary provider : Taavon Prenatal course complicated by [facility] / s/p tubal reversal / GDMa2 -poor compliance & control / + GBS  Prenatal Labs: ABO, Rh: --/--/O NEG (01/11 0830) - given Antibody: POS (01/11 0830) Rubella: Equivocal (06/16 0000)  / MMR booster -given RPR: NON REACTIVE (01/11 0820)  HBsAg: Negative (06/16 0000)  HIV: Non-reactive (06/16 0000)  GTT : ABNORMAL GBS: Positive (12/23 0000)   Medical / Surgical History :  Past medical history:  Past Medical History  Diagnosis Date  . Medical history non-contributory   . Gestational diabetes mellitus, antepartum   . Herpes   . Abnormal Pap smear   . Postpartum care following vaginal delivery (1/12) 10/10/2013    Past surgical history:  Past Surgical History  Procedure Laterality Date  . Tubal ligation    . Tubal reversal  09/24/2012  . Wisdom tooth extraction    . Leep      Family History:  Family History  Problem Relation Age of Onset  . Transient ischemic attack Mother   . Diabetes Father   . Heart disease Father   . Hypertension Father   . Heart attack Father   . Heart disease Maternal Grandmother   . Diabetes Maternal Grandfather   . Diabetes Paternal Grandmother   . Emphysema Paternal Grandmother   . Cancer Paternal Grandfather     Social History:  reports that she has never smoked. She quit smokeless tobacco use about 12 months ago. She reports that she drinks alcohol. She reports that she does not use illicit drugs.  Allergies: Codeine; Penicillins; Sulfa  antibiotics; and Lyrica   Current Medications at time of admission:  Prior to Admission medications   Medication Sig Start Date End Date Taking? Authorizing Provider  glyBURIDE (DIABETA) 1.25 MG tablet Take 1.25 mg by mouth 2 (two) times daily with a meal. Patient takes 1.25 in morning and takes 0.625 in the evening   Yes Historical Provider, MD  Prenatal Vit-Fe Fumarate-FA (PRENATAL MULTIVITAMIN) TABS Take 1 tablet by mouth daily at 12 noon.   Yes Historical Provider, MD  valACYclovir (VALTREX) 500 MG tablet Take 500 mg by mouth 2 (two) times daily.   Yes Historical Provider, MD  iron polysaccharides (NIFEREX) 150 MG capsule Take 1 capsule (150 mg total) by mouth 2 (two) times daily. 10/12/13   Artelia Laroche, CNM  oxyCODONE-acetaminophen (PERCOCET/ROXICET) 5-325 MG per tablet Take 1-2 tablets by mouth every 4 (four) hours as needed for severe pain (moderate - severe pain). 10/12/13   Artelia Laroche, CNM     Intrapartum Course:  Admit for induction labor with cytotec - pitocin / labor progression to complete dilation with normal labor curve Pain management: epidural  Procedures: SVD on 1/12 with delivery of  female newborn by Dr Ronita Hipps   See operative report for further details APGAR (1 MIN): 7   APGAR (5 MINS): 8    Physical Exam:   VSS: Temp:  [98.4 F (36.9 C)-98.5 F (36.9 C)] 98.5 F (36.9 C) (01/14 4097) Pulse Rate:  [84-89] 84 (01/14 0642) Resp:  [  18-19] 18 (01/14 0642) BP: (113-122)/(67-74) 113/67 mmHg (01/14 0642) SpO2:  [100 %] 100 % (01/14 0642)  LABS:  Recent Labs  10/11/13 0530 10/12/13 0705  WBC 7.8 7.6  HGB 7.4* 7.4*  PLT 88* 99*    General: ambulatory / no dizzines or symptoms Heart: RRR Lungs: clear  Abdomen: soft and non-tender / non-distended / active BS  Extremities: 1+ edema / negative Homans  Fundus firm / small lochia  Discharge Instructions:  Discharged Condition: stable  Activity: pelvic rest and postoperative restrictions x 2   Diet: low  carb / increase water intake  Medications:    Medication List    STOP taking these medications       glyBURIDE 1.25 MG tablet  Commonly known as:  DIABETA      TAKE these medications       iron polysaccharides 150 MG capsule  Commonly known as:  NIFEREX  Take 1 capsule (150 mg total) by mouth 2 (two) times daily.     oxyCODONE-acetaminophen 5-325 MG per tablet  Commonly known as:  PERCOCET/ROXICET  Take 1-2 tablets by mouth every 4 (four) hours as needed for severe pain (moderate - severe pain).     prenatal multivitamin Tabs tablet  Take 1 tablet by mouth daily at 12 noon.     valACYclovir 500 MG tablet  Commonly known as:  VALTREX  Take 500 mg by mouth 2 (two) times daily.        Postpartum Instructions: [facility] discharge booklet - instructions reviewed  Discharge to: Home  Follow up :   [facility] in 6 weeks for routine postpartum visit with Dr Ronita Hipps                Signed: Artelia Laroche CNM, MSN, [facility] 10/12/2013, 9:47 AM

## 2013-10-12 NOTE — Lactation Note (Signed)
This note was copied from the chart of Stacey Proctor. Lactation Consultation Note  Patient Name: Stacey Proctor ZOXWR'UToday's Date: 10/12/2013 Reason for consult: Follow-up assessment Mom reports breastfeeding going well, and baby cluster feeding. Engorgement prevention and treatment discussed. Mom reports small crack on left nipple. Enc to use expressed breast milk for healing. Mom given another set of comfort gels with instruction. Mom reports that she has a DEBP ordered for home. Given a hand pump for comfort. Mom aware of breast milk storage times per Baby and Me booklet. Mom aware of OP/BFSG services.  Maternal Data    Feeding Feeding Type: Breast Fed  LATCH Score/Interventions                      Lactation Tools Discussed/Used     Consult Status Consult Status: PRN    Geralynn OchsWILLIARD, Imad Shostak 10/12/2013, 11:41 AM

## 2013-10-13 LAB — TYPE AND SCREEN
ABO/RH(D): O NEG
Antibody Screen: POSITIVE
DAT, IGG: NEGATIVE
UNIT DIVISION: 0
Unit division: 0

## 2014-07-31 ENCOUNTER — Encounter (HOSPITAL_COMMUNITY): Payer: Self-pay

## 2015-03-29 LAB — OB RESULTS CONSOLE HIV ANTIBODY (ROUTINE TESTING): HIV: NONREACTIVE

## 2015-03-29 LAB — OB RESULTS CONSOLE ABO/RH: RH TYPE: NEGATIVE

## 2015-03-29 LAB — OB RESULTS CONSOLE HEPATITIS B SURFACE ANTIGEN: HEP B S AG: NEGATIVE

## 2015-03-29 LAB — OB RESULTS CONSOLE RPR: RPR: NONREACTIVE

## 2015-03-29 LAB — OB RESULTS CONSOLE RUBELLA ANTIBODY, IGM: RUBELLA: NON-IMMUNE/NOT IMMUNE

## 2015-03-29 LAB — OB RESULTS CONSOLE ANTIBODY SCREEN: Antibody Screen: NEGATIVE

## 2015-04-10 LAB — OB RESULTS CONSOLE GC/CHLAMYDIA
Chlamydia: NEGATIVE
GC PROBE AMP, GENITAL: NEGATIVE

## 2015-09-05 ENCOUNTER — Inpatient Hospital Stay (HOSPITAL_COMMUNITY)
Admission: AD | Admit: 2015-09-05 | Payer: PRIVATE HEALTH INSURANCE | Source: Ambulatory Visit | Admitting: Obstetrics & Gynecology

## 2015-09-30 NOTE — L&D Delivery Note (Signed)
Delivery Note At 2:44 PM a viable and healthy female was delivered via Vaginal, Spontaneous Delivery (Presentation: Right Occiput Anterior).  APGAR: 9, 9; weight 6 lb 9.5 oz (2990 g).   Placenta status: Intact, Spontaneous.  Cord: 3 vessels with the following complications: None.  Cord pH: na  Anesthesia: Epidural Local  Episiotomy: None Lacerations: 2nd degree Suture Repair: 2.0 vicryl rapide Est. Blood Loss (mL): 100  Mom to postpartum.  Baby to Couplet care / Skin to Skin.  Danay Mckellar J 10/25/2015, 5:23 PM

## 2015-10-05 LAB — OB RESULTS CONSOLE GBS: GBS: NEGATIVE

## 2015-10-15 ENCOUNTER — Other Ambulatory Visit: Payer: Self-pay | Admitting: Obstetrics and Gynecology

## 2015-10-18 ENCOUNTER — Encounter (HOSPITAL_COMMUNITY): Payer: Self-pay | Admitting: *Deleted

## 2015-10-18 ENCOUNTER — Other Ambulatory Visit: Payer: Self-pay | Admitting: Obstetrics and Gynecology

## 2015-10-18 ENCOUNTER — Telehealth (HOSPITAL_COMMUNITY): Payer: Self-pay | Admitting: *Deleted

## 2015-10-18 NOTE — Telephone Encounter (Signed)
Preadmission screenPreadmission screen 

## 2015-10-24 ENCOUNTER — Inpatient Hospital Stay (HOSPITAL_COMMUNITY): Admission: RE | Admit: 2015-10-24 | Payer: PRIVATE HEALTH INSURANCE | Source: Ambulatory Visit

## 2015-10-25 ENCOUNTER — Encounter (HOSPITAL_COMMUNITY): Payer: Self-pay | Admitting: *Deleted

## 2015-10-25 ENCOUNTER — Inpatient Hospital Stay (HOSPITAL_COMMUNITY): Payer: BC Managed Care – PPO | Admitting: Anesthesiology

## 2015-10-25 ENCOUNTER — Inpatient Hospital Stay (HOSPITAL_COMMUNITY)
Admission: AD | Admit: 2015-10-25 | Discharge: 2015-10-29 | DRG: 775 | Disposition: A | Discharge status: Home / Self Care | Payer: BC Managed Care – PPO | Source: Ambulatory Visit | Attending: Obstetrics and Gynecology | Admitting: Obstetrics and Gynecology

## 2015-10-25 DIAGNOSIS — E861 Hypovolemia: Secondary | ICD-10-CM | POA: Diagnosis present

## 2015-10-25 DIAGNOSIS — Z3A39 39 weeks gestation of pregnancy: Secondary | ICD-10-CM

## 2015-10-25 DIAGNOSIS — O24919 Unspecified diabetes mellitus in pregnancy, unspecified trimester: Secondary | ICD-10-CM | POA: Diagnosis present

## 2015-10-25 DIAGNOSIS — O9081 Anemia of the puerperium: Secondary | ICD-10-CM | POA: Diagnosis not present

## 2015-10-25 DIAGNOSIS — IMO0001 Reserved for inherently not codable concepts without codable children: Secondary | ICD-10-CM | POA: Diagnosis present

## 2015-10-25 DIAGNOSIS — O894 Spinal and epidural anesthesia-induced headache during the puerperium: Secondary | ICD-10-CM | POA: Diagnosis not present

## 2015-10-25 DIAGNOSIS — O24425 Gestational diabetes mellitus in childbirth, controlled by oral hypoglycemic drugs: Secondary | ICD-10-CM | POA: Diagnosis present

## 2015-10-25 DIAGNOSIS — D62 Acute posthemorrhagic anemia: Secondary | ICD-10-CM | POA: Diagnosis not present

## 2015-10-25 DIAGNOSIS — Z8249 Family history of ischemic heart disease and other diseases of the circulatory system: Secondary | ICD-10-CM | POA: Diagnosis not present

## 2015-10-25 DIAGNOSIS — Z833 Family history of diabetes mellitus: Secondary | ICD-10-CM

## 2015-10-25 HISTORY — DX: Reserved for inherently not codable concepts without codable children: IMO0001

## 2015-10-25 HISTORY — DX: Acute posthemorrhagic anemia: D62

## 2015-10-25 HISTORY — DX: Spinal and epidural anesthesia-induced headache during the puerperium: O89.4

## 2015-10-25 LAB — CBC
HEMATOCRIT: 27.2 % — AB (ref 36.0–46.0)
HEMOGLOBIN: 8.5 g/dL — AB (ref 12.0–15.0)
MCH: 23.4 pg — AB (ref 26.0–34.0)
MCHC: 31.3 g/dL (ref 30.0–36.0)
MCV: 74.9 fL — ABNORMAL LOW (ref 78.0–100.0)
Platelets: 146 10*3/uL — ABNORMAL LOW (ref 150–400)
RBC: 3.63 MIL/uL — ABNORMAL LOW (ref 3.87–5.11)
RDW: 16.6 % — AB (ref 11.5–15.5)
WBC: 7.5 10*3/uL (ref 4.0–10.5)

## 2015-10-25 LAB — RAPID HIV SCREEN (HIV 1/2 AB+AG)
HIV 1/2 ANTIBODIES: NONREACTIVE
HIV-1 P24 ANTIGEN - HIV24: NONREACTIVE

## 2015-10-25 LAB — GLUCOSE, CAPILLARY: GLUCOSE-CAPILLARY: 92 mg/dL (ref 65–99)

## 2015-10-25 MED ORDER — ACETAMINOPHEN 325 MG PO TABS
650.0000 mg | ORAL_TABLET | ORAL | Status: DC | PRN
  Administered 2015-10-25 – 2015-10-26 (×2): 650 mg via ORAL
  Filled 2015-10-25 (×3): qty 2

## 2015-10-25 MED ORDER — IBUPROFEN 600 MG PO TABS
600.0000 mg | ORAL_TABLET | Freq: Four times a day (QID) | ORAL | Status: DC
  Administered 2015-10-25 – 2015-10-29 (×15): 600 mg via ORAL
  Filled 2015-10-25 (×15): qty 1

## 2015-10-25 MED ORDER — OXYCODONE-ACETAMINOPHEN 5-325 MG PO TABS: 1.0000 | ORAL_TABLET | ORAL | Status: DC | PRN

## 2015-10-25 MED ORDER — METHYLERGONOVINE MALEATE 0.2 MG PO TABS: 0.2000 mg | ORAL_TABLET | ORAL | Status: DC | PRN

## 2015-10-25 MED ORDER — LIDOCAINE HCL (PF) 1 % IJ SOLN
30.0000 mL | INTRAMUSCULAR | Status: DC | PRN
  Administered 2015-10-25: 30 mL via SUBCUTANEOUS
  Filled 2015-10-25: qty 30

## 2015-10-25 MED ORDER — MISOPROSTOL 25 MCG QUARTER TABLET
25.0000 ug | ORAL_TABLET | ORAL | Status: DC | PRN
  Filled 2015-10-25: qty 1

## 2015-10-25 MED ORDER — ONDANSETRON HCL 4 MG/2ML IJ SOLN
4.0000 mg | INTRAMUSCULAR | Status: DC | PRN
Start: 2015-10-25 — End: 2015-10-27

## 2015-10-25 MED ORDER — PHENYLEPHRINE 40 MCG/ML (10ML) SYRINGE FOR IV PUSH (FOR BLOOD PRESSURE SUPPORT): 80.0000 ug | PREFILLED_SYRINGE | INTRAVENOUS | Status: DC | PRN

## 2015-10-25 MED ORDER — OXYTOCIN 10 UNIT/ML IJ SOLN
1.0000 m[IU]/min | INTRAMUSCULAR | Status: DC
  Filled 2015-10-25: qty 4

## 2015-10-25 MED ORDER — OXYTOCIN 10 UNIT/ML IJ SOLN: 2.5000 [IU]/h | INTRAVENOUS | Status: DC

## 2015-10-25 MED ORDER — SIMETHICONE 80 MG PO CHEW
80.0000 mg | CHEWABLE_TABLET | ORAL | Status: DC | PRN
  Administered 2015-10-27: 80 mg via ORAL
  Filled 2015-10-25: qty 1

## 2015-10-25 MED ORDER — POLYSACCHARIDE IRON COMPLEX 150 MG PO CAPS
150.0000 mg | ORAL_CAPSULE | Freq: Two times a day (BID) | ORAL | Status: DC
  Administered 2015-10-25 – 2015-10-29 (×8): 150 mg via ORAL
  Filled 2015-10-25 (×8): qty 1

## 2015-10-25 MED ORDER — ONDANSETRON HCL 4 MG PO TABS: 4.0000 mg | ORAL_TABLET | ORAL | Status: DC | PRN

## 2015-10-25 MED ORDER — TERBUTALINE SULFATE 1 MG/ML IJ SOLN
0.2500 mg | Freq: Once | INTRAMUSCULAR | Status: DC | PRN
  Filled 2015-10-25: qty 1

## 2015-10-25 MED ORDER — FENTANYL 2.5 MCG/ML BUPIVACAINE 1/10 % EPIDURAL INFUSION (WH - ANES)
14.0000 mL/h | INTRAMUSCULAR | Status: DC | PRN
  Filled 2015-10-25: qty 125

## 2015-10-25 MED ORDER — OXYTOCIN BOLUS FROM INFUSION
500.0000 mL | INTRAVENOUS | Status: DC
  Administered 2015-10-25: 500 mL via INTRAVENOUS

## 2015-10-25 MED ORDER — FENTANYL 2.5 MCG/ML BUPIVACAINE 1/10 % EPIDURAL INFUSION (WH - ANES): 14.0000 mL/h | INTRAMUSCULAR | Status: DC | PRN

## 2015-10-25 MED ORDER — EPHEDRINE 5 MG/ML INJ
10.0000 mg | INTRAVENOUS | Status: AC | PRN
  Administered 2015-10-25 (×2): 10 mg via INTRAVENOUS
  Filled 2015-10-25: qty 4

## 2015-10-25 MED ORDER — LACTATED RINGERS IV SOLN
500.0000 mL | INTRAVENOUS | Status: DC | PRN
  Administered 2015-10-25 (×2): 1000 mL via INTRAVENOUS
  Administered 2015-10-25: 500 mL via INTRAVENOUS

## 2015-10-25 MED ORDER — EPHEDRINE 5 MG/ML INJ: 10.0000 mg | INTRAVENOUS | Status: DC | PRN

## 2015-10-25 MED ORDER — LANOLIN HYDROUS EX OINT: TOPICAL_OINTMENT | CUTANEOUS | Status: DC | PRN

## 2015-10-25 MED ORDER — PHENYLEPHRINE 40 MCG/ML (10ML) SYRINGE FOR IV PUSH (FOR BLOOD PRESSURE SUPPORT)
80.0000 ug | PREFILLED_SYRINGE | INTRAVENOUS | Status: AC | PRN
  Administered 2015-10-25 (×3): 80 ug via INTRAVENOUS
  Filled 2015-10-25: qty 20

## 2015-10-25 MED ORDER — METHYLERGONOVINE MALEATE 0.2 MG/ML IJ SOLN
INTRAMUSCULAR | Status: AC
  Filled 2015-10-25: qty 1

## 2015-10-25 MED ORDER — DIBUCAINE 1 % RE OINT: 1.0000 "application " | TOPICAL_OINTMENT | RECTAL | Status: DC | PRN

## 2015-10-25 MED ORDER — LIDOCAINE HCL (PF) 1 % IJ SOLN
INTRAMUSCULAR | Status: DC | PRN
  Administered 2015-10-25 (×2): 3 mL via EPIDURAL

## 2015-10-25 MED ORDER — DIPHENHYDRAMINE HCL 50 MG/ML IJ SOLN: 12.5000 mg | INTRAMUSCULAR | Status: DC | PRN

## 2015-10-25 MED ORDER — FLEET ENEMA 7-19 GM/118ML RE ENEM
1.0000 | ENEMA | RECTAL | Status: DC | PRN
Start: 2015-10-25 — End: 2015-10-25

## 2015-10-25 MED ORDER — CITRIC ACID-SODIUM CITRATE 334-500 MG/5ML PO SOLN: 30.0000 mL | ORAL | Status: DC | PRN

## 2015-10-25 MED ORDER — SENNOSIDES-DOCUSATE SODIUM 8.6-50 MG PO TABS
2.0000 | ORAL_TABLET | ORAL | Status: DC
  Administered 2015-10-26 – 2015-10-29 (×4): 2 via ORAL
  Filled 2015-10-25 (×4): qty 2

## 2015-10-25 MED ORDER — DIPHENHYDRAMINE HCL 25 MG PO CAPS: 25.0000 mg | ORAL_CAPSULE | Freq: Four times a day (QID) | ORAL | Status: DC | PRN

## 2015-10-25 MED ORDER — ZOLPIDEM TARTRATE 5 MG PO TABS: 5.0000 mg | ORAL_TABLET | Freq: Every evening | ORAL | Status: DC | PRN

## 2015-10-25 MED ORDER — LACTATED RINGERS IV SOLN
INTRAVENOUS | Status: DC
  Administered 2015-10-25 (×2): 125 mL/h via INTRAVENOUS
  Administered 2015-10-25: 15:00:00 via INTRAVENOUS

## 2015-10-25 MED ORDER — WITCH HAZEL-GLYCERIN EX PADS: 1.0000 "application " | MEDICATED_PAD | CUTANEOUS | Status: DC | PRN

## 2015-10-25 MED ORDER — ONDANSETRON HCL 4 MG/2ML IJ SOLN: 4.0000 mg | Freq: Four times a day (QID) | INTRAMUSCULAR | Status: DC | PRN

## 2015-10-25 MED ORDER — BENZOCAINE-MENTHOL 20-0.5 % EX AERO
1.0000 "application " | INHALATION_SPRAY | CUTANEOUS | Status: DC | PRN
  Administered 2015-10-25: 1 via TOPICAL
  Filled 2015-10-25: qty 56

## 2015-10-25 MED ORDER — TETANUS-DIPHTH-ACELL PERTUSSIS 5-2.5-18.5 LF-MCG/0.5 IM SUSP: 0.5000 mL | Freq: Once | INTRAMUSCULAR | Status: DC

## 2015-10-25 MED ORDER — OXYCODONE-ACETAMINOPHEN 5-325 MG PO TABS: 2.0000 | ORAL_TABLET | ORAL | Status: DC | PRN

## 2015-10-25 MED ORDER — ACETAMINOPHEN 325 MG PO TABS: 650.0000 mg | ORAL_TABLET | ORAL | Status: DC | PRN

## 2015-10-25 MED ORDER — METHYLERGONOVINE MALEATE 0.2 MG/ML IJ SOLN: 0.2000 mg | INTRAMUSCULAR | Status: DC | PRN

## 2015-10-25 MED ORDER — PRENATAL MULTIVITAMIN CH
1.0000 | ORAL_TABLET | Freq: Every day | ORAL | Status: DC
  Administered 2015-10-27 – 2015-10-29 (×3): 1 via ORAL
  Filled 2015-10-25 (×3): qty 1

## 2015-10-25 MED ORDER — OXYTOCIN 10 UNIT/ML IJ SOLN
1.0000 m[IU]/min | INTRAMUSCULAR | Status: DC
  Administered 2015-10-25: 2 m[IU]/min via INTRAVENOUS

## 2015-10-25 NOTE — Progress Notes (Signed)
Stacey Proctor is a 40 y.o. [id number] at [redacted]w[redacted]d by LMP admitted for induction of labor due to Gestational diabetes on Glyburide.  Subjective: Comfortable s/p epidural  Objective: BP 125/83 mmHg  Pulse 75  Temp(Src) 98.1 F (36.7 C) (Oral)  Resp 16  Ht  (1.549 m)  Wt 78.019 kg (172 lb)  BMI 32.52 kg/m2  SpO2 100%  LMP 01/25/2015      FHT:  FHR: 145 bpm, variability: moderate,  accelerations:  Present,  decelerations:  Present occ variable UC:   regular, every 3 minutes SVE:   5+/80/-1 IUPC placed without difficulty  Labs: Lab Results  Component Value Date   WBC 7.5 10/25/2015   HGB 8.5* 10/25/2015   HCT 27.2* 10/25/2015   MCV 74.9* 10/25/2015   PLT 146* 10/25/2015    Assessment / Plan: Induction of labor due to gestational diabetes,  progressing well on pitocin  A2DM stable. Gestational anemia- type and screen done  Labor: Progressing normally Preeclampsia:  no signs or symptoms of toxicity Fetal Wellbeing:  Category I Pain Control:  Epidural I/D:  n/a Anticipated MOD:  NSVD  Vinia Jemmott J 10/25/2015, 1:29 PM

## 2015-10-25 NOTE — Anesthesia Procedure Notes (Signed)
Epidural Patient location during procedure: OB Start time: 10/25/2015 12:58 PM End time: 10/25/2015 1:00 AM  Preanesthetic Checklist Completed: patient identified, surgical consent, pre-op evaluation, timeout performed, IV checked, risks and benefits discussed and monitors and equipment checked  Epidural Patient position: sitting Prep: site prepped and draped and DuraPrep Patient monitoring: continuous pulse ox and blood pressure Approach: midline Location: L3-L4 Injection technique: LOR air  Needle:  Needle type: Tuohy  Needle gauge: 17 G Needle length: 9 cm and 9 Needle insertion depth: 6 cm Catheter type: closed end flexible Catheter size: 19 Gauge Catheter at skin depth: 10 cm Test dose: negative and Other  Assessment Sensory level: T3 Events: blood not aspirated, injection not painful, no injection resistance, negative IV test and no paresthesia  Additional Notes Possible wet tap. Will monitor closely.Reason for block:procedure for pain

## 2015-10-25 NOTE — Lactation Note (Signed)
This note was copied from the chart of Stacey Proctor. Lactation Consultation Note  Patient Name: Stacey Proctor [id number] Date: 10/25/2015 Reason for consult: Initial assessment Baby at 6 hr of life. Experienced bf mom reports feeding are going well. Denies breast or nipple pain. She is worried about pumping because she is going back to work at 8 wk PP. Discussed baby behavior, feeding frequency, pumping, baby belly size, voids, wt loss, breast changes, and nipple care. Mom stated that she can manually express and has seen colostrum. Given a spoon. Given lactation handouts. Aware of OP services and support group.    Maternal Data Has patient been taught Hand Expression?: Yes Does the patient have breastfeeding experience prior to this delivery?: Yes  Feeding Feeding Type: Breast Fed Length of feed: 10 min  LATCH Score/Interventions Latch: Grasps breast easily, tongue down, lips flanged, rhythmical sucking.  Audible Swallowing: Spontaneous and intermittent  Type of Nipple: Everted at rest and after stimulation  Comfort (Breast/Nipple): Soft / non-tender     Hold (Positioning): No assistance needed to correctly position infant at breast.  LATCH Score: 10  Lactation Tools Discussed/Used     Consult Status Consult Status: Follow-up Date: 10/26/15 Follow-up type: In-patient    Rulon Eisenmenger 10/25/2015, 9:35 PM

## 2015-10-25 NOTE — Anesthesia Preprocedure Evaluation (Signed)
Anesthesia Evaluation  Patient identified by MRN, date of birth, ID band Patient awake    Reviewed: Allergy & Precautions, H&P , NPO status , Patient's Chart, lab work & pertinent test results  Airway Mallampati: I  TM Distance: >3 FB Neck ROM: full    Dental no notable dental hx.    Pulmonary neg pulmonary ROS,    Pulmonary exam normal        Cardiovascular negative cardio ROS Normal cardiovascular exam     Neuro/Psych negative neurological ROS  negative psych ROS   GI/Hepatic negative GI ROS, Neg liver ROS,   Endo/Other  diabetes  Renal/GU negative Renal ROS     Musculoskeletal   Abdominal (+) + obese,   Peds  Hematology negative hematology ROS (+)   Anesthesia Other Findings   Reproductive/Obstetrics (+) Pregnancy                             Anesthesia Physical Anesthesia Plan  ASA: II  Anesthesia Plan: Epidural   Post-op Pain Management:    Induction:   Airway Management Planned:   Additional Equipment:   Intra-op Plan:   Post-operative Plan:   Informed Consent: I have reviewed the patients History and Physical, chart, labs and discussed the procedure including the risks, benefits and alternatives for the proposed anesthesia with the patient or authorized representative who has indicated his/her understanding and acceptance.     Plan Discussed with:   Anesthesia Plan Comments:         Anesthesia Quick Evaluation

## 2015-10-25 NOTE — H&P (Signed)
Stacey Proctor is a 40 y.o. female presenting for IOL due to Class A2DM.  Maternal Medical History:  Reason for admission: Contractions.   Contractions: Onset was less than 1 hour ago.   Perceived severity is mild.    Fetal activity: Perceived fetal activity is normal.   Last perceived fetal movement was within the past hour.    Prenatal complications: no prenatal complications Prenatal Complications - Diabetes: type 2. Diabetes is managed by oral agent (monotherapy).      OB History    Gravida Para Term Preterm AB TAB SAB Ectopic Multiple Living   0 1 0 1 0 0 3     Past Medical History  Diagnosis Date  . Medical history non-contributory   . Gestational diabetes mellitus, antepartum   . Herpes   . Abnormal Pap smear   . Postpartum care following vaginal delivery (1/12) 10/10/2013  . Vaginal Pap smear, abnormal   . Gestational diabetes    Past Surgical History  Procedure Laterality Date  . Tubal ligation    . Tubal reversal  09/24/2012  . Wisdom tooth extraction    . Leep     Family History: family history includes Birth defects in her son; Cancer in her paternal grandfather; Crohn's disease in her mother; Diabetes in her father, maternal grandfather, and paternal grandmother; Emphysema in her paternal grandfather; Heart attack in her father and paternal grandfather; Heart disease in her father and maternal grandmother; Hypertension in her father and paternal grandfather; Rheum arthritis in her father and mother; Seizures in her mother; Transient ischemic attack in her mother. Social History:  reports that she has never smoked. She quit smokeless tobacco use about 3 years ago. She reports that she drinks alcohol. She reports that she does not use illicit drugs.   Prenatal Transfer Tool  Maternal Diabetes: Yes:  Diabetes Type:  Insulin/Medication controlled Genetic Screening: Normal Maternal Ultrasounds/Referrals: Normal Fetal Ultrasounds or other Referrals:   None Maternal Substance Abuse:  No Significant Maternal Medications:  None Significant Maternal Lab Results:  None Other Comments:  None  Review of Systems  Constitutional: Negative.   All other systems reviewed and are negative.   Dilation: 3 Exam by:: Dr. Billy Coast Blood pressure 126/75, pulse 102, temperature 98.7 F (37.1 C), temperature source Oral, resp. rate 16, height  (1.549 m), weight 78.019 kg (172 lb), last menstrual period 01/25/2015, unknown if currently breastfeeding. Maternal Exam:  Uterine Assessment: Contraction frequency is irregular.   Abdomen: Patient reports no abdominal tenderness. Fetal presentation: vertex  Introitus: Normal vulva. Normal vagina.  Ferning test: positive.  Nitrazine test: positive. Amniotic fluid character: clear.  Pelvis: adequate for delivery.   Cervix: Cervix evaluated by digital exam.     Physical Exam  Vitals reviewed. Constitutional: She is oriented to person, place, and time. She appears well-developed and well-nourished.  HENT:  Head: Atraumatic.  Neck: Normal range of motion. Neck supple.  Cardiovascular: Normal rate and regular rhythm.   Respiratory: Effort normal and breath sounds normal.  GI: Soft. Bowel sounds are normal.  Genitourinary: Vagina normal and uterus normal.  Musculoskeletal: Normal range of motion.  Neurological: She is alert and oriented to person, place, and time. She has normal reflexes.  Skin: Skin is warm and dry.  Psychiatric: She has a normal mood and affect.    Prenatal labs: ABO, Rh: O/Negative/-- (06/30 0000) Antibody: Negative (06/30 0000) Rubella: Nonimmune (06/30 0000) RPR: Nonreactive (06/30 0000)  HBsAg: Negative (06/30 0000)  HIV:  Non-reactive (06/30 0000)  GBS: Negative (01/06 0000)   Assessment/Plan: 39 weekl IUP Class A2DM IOL   Stacey Proctor J 10/25/2015, 8:49 AM

## 2015-10-26 ENCOUNTER — Inpatient Hospital Stay (HOSPITAL_COMMUNITY): Payer: BC Managed Care – PPO | Admitting: Anesthesiology

## 2015-10-26 ENCOUNTER — Encounter (HOSPITAL_COMMUNITY): Payer: Self-pay | Admitting: Obstetrics and Gynecology

## 2015-10-26 DIAGNOSIS — D62 Acute posthemorrhagic anemia: Secondary | ICD-10-CM | POA: Diagnosis not present

## 2015-10-26 DIAGNOSIS — O894 Spinal and epidural anesthesia-induced headache during the puerperium: Secondary | ICD-10-CM

## 2015-10-26 DIAGNOSIS — IMO0001 Reserved for inherently not codable concepts without codable children: Secondary | ICD-10-CM

## 2015-10-26 HISTORY — DX: Spinal and epidural anesthesia-induced headache during the puerperium: O89.4

## 2015-10-26 HISTORY — DX: Acute posthemorrhagic anemia: D62

## 2015-10-26 HISTORY — DX: Reserved for inherently not codable concepts without codable children: IMO0001

## 2015-10-26 LAB — CBC
HCT: 22.7 % — ABNORMAL LOW (ref 36.0–46.0)
HEMOGLOBIN: 7.1 g/dL — AB (ref 12.0–15.0)
MCH: 23.4 pg — ABNORMAL LOW (ref 26.0–34.0)
MCHC: 31.3 g/dL (ref 30.0–36.0)
MCV: 74.9 fL — ABNORMAL LOW (ref 78.0–100.0)
PLATELETS: 116 10*3/uL — AB (ref 150–400)
RBC: 3.03 MIL/uL — ABNORMAL LOW (ref 3.87–5.11)
RDW: 16.7 % — AB (ref 11.5–15.5)
WBC: 6.9 10*3/uL (ref 4.0–10.5)

## 2015-10-26 LAB — GLUCOSE, CAPILLARY: Glucose-Capillary: 92 mg/dL (ref 65–99)

## 2015-10-26 LAB — RPR: RPR Ser Ql: NONREACTIVE

## 2015-10-26 MED ORDER — LACTATED RINGERS IV SOLN
INTRAVENOUS | Status: AC
  Administered 2015-10-26: 11:00:00 via INTRAVENOUS

## 2015-10-26 MED ORDER — MIDAZOLAM HCL 2 MG/2ML IJ SOLN: 1.0000 mg | Freq: Once | INTRAMUSCULAR | Status: DC | PRN

## 2015-10-26 MED ORDER — MIDAZOLAM HCL 2 MG/2ML IJ SOLN
1.0000 mg | Freq: Once | INTRAMUSCULAR | Status: AC | PRN
  Administered 2015-10-26: 0.5 mg via INTRAVENOUS

## 2015-10-26 MED ORDER — MIDAZOLAM HCL 2 MG/2ML IJ SOLN
INTRAMUSCULAR | Status: AC
  Administered 2015-10-26: 0.5 mg via INTRAVENOUS
  Filled 2015-10-26: qty 2

## 2015-10-26 MED ORDER — LACTATED RINGERS IV BOLUS (SEPSIS)
500.0000 mL | Freq: Once | INTRAVENOUS | Status: AC
  Administered 2015-10-26: 500 mL via INTRAVENOUS

## 2015-10-26 MED ORDER — LACTATED RINGERS IV SOLN
INTRAVENOUS | Status: DC
  Administered 2015-10-26: via INTRAVENOUS

## 2015-10-26 MED ORDER — FENTANYL CITRATE (PF) 100 MCG/2ML IJ SOLN
50.0000 ug | Freq: Once | INTRAMUSCULAR | Status: AC | PRN
  Administered 2015-10-26: 50 ug via INTRAVENOUS

## 2015-10-26 MED ORDER — MEASLES, MUMPS & RUBELLA VAC ~~LOC~~ INJ
0.5000 mL | INJECTION | Freq: Once | SUBCUTANEOUS | Status: AC
  Administered 2015-10-29: 0.5 mL via SUBCUTANEOUS
  Filled 2015-10-26 (×2): qty 0.5

## 2015-10-26 MED ORDER — HYDROCODONE-ACETAMINOPHEN 5-325 MG PO TABS
1.0000 | ORAL_TABLET | ORAL | Status: DC | PRN
  Administered 2015-10-26 – 2015-10-29 (×13): 1 via ORAL
  Filled 2015-10-26 (×14): qty 1

## 2015-10-26 MED ORDER — MAGNESIUM OXIDE 400 (241.3 MG) MG PO TABS
200.0000 mg | ORAL_TABLET | Freq: Every day | ORAL | Status: DC
  Administered 2015-10-26 – 2015-10-27 (×2): 200 mg via ORAL
  Filled 2015-10-26 (×2): qty 0.5

## 2015-10-26 MED ORDER — SODIUM CHLORIDE 0.9 % IV SOLN
1000.0000 mg | Freq: Once | INTRAVENOUS | Status: AC
  Administered 2015-10-26: 1000 mg via INTRAVENOUS
  Filled 2015-10-26: qty 4

## 2015-10-26 NOTE — Anesthesia Procedure Notes (Signed)
Epidural Patient location during procedure: OB Start time: 10/26/2015 11:30 AM End time: 10/26/2015 11:50 AM  Staffing Anesthesiologist: Leilani Able Performed by: anesthesiologist   Preanesthetic Checklist Completed: patient identified, surgical consent, pre-op evaluation, timeout performed, IV checked, risks and benefits discussed and monitors and equipment checked  Epidural Patient position: sitting Prep: site prepped and draped and DuraPrep Patient monitoring: continuous pulse ox and blood pressure Approach: midline Location: L3-L4 Injection technique: LOR air  Needle:  Needle type: Tuohy  Needle gauge: 17 G Needle length: 9 cm and 9 Needle insertion depth: 6 cm Catheter type: closed end flexible Catheter size: 19 Gauge Catheter at skin depth: 10 cm Test dose: negative  Assessment Events: blood not aspirated, injection not painful, no injection resistance, negative IV test and no paresthesia  Additional Notes After multiple attempts finally got LOR  through prior epidural. I was able to inject 15cc of the patients blood obtained under sterile conditions by Telford Nab RN from the patients L AC fossa. The patient tolerated the procedure ok. There were no apparent complications . Patient is currently supine.Reason for block:procedure for pain

## 2015-10-26 NOTE — Anesthesia Postprocedure Evaluation (Addendum)
Anesthesia Post Note  Patient: Stacey Proctor  Procedure(s) Performed: * No procedures listed *  Patient location during evaluation: Mother Baby Anesthesia Type: Epidural Level of consciousness: awake and alert, oriented and patient cooperative Pain management: pain level controlled Vital Signs Assessment: post-procedure vital signs reviewed and stable Respiratory status: spontaneous breathing Cardiovascular status: stable Postop Assessment: no headache, epidural receding, patient able to bend at knees and no signs of nausea or vomiting Anesthetic complications: no Comments: Pt interviewed ~0900.  States pain controlled with pain score of 2.  Pt dressed and sitting in chair eating breakfast.  Stated was receiving motrin/tylenol alternating for adequate pain control.    Last Vitals:  Filed Vitals:   10/25/15 2111 10/26/15 0700  BP: 127/50 119/69  Pulse: 72 80  Temp: 36.4 C 36.7 C  Resp: 16 18    Last Pain:  Filed Vitals:   10/26/15 0723  PainSc: 6                  Merril Nagy

## 2015-10-26 NOTE — Progress Notes (Addendum)
Anesthesia notified of patients continued headache and ringing in her ears post epidural.  MD to come and see patient.

## 2015-10-26 NOTE — Progress Notes (Signed)
Patient ID: Stacey Proctor, female   DOB: November 10, 1975, 40 y.o.   MRN: [id number] PPD # 1 SVD  S:  Reports a bad post epidural H/A and "ringing in her ear".             Tolerating po/ No nausea or vomiting             Bleeding is light             Pain in bottom controlled with ibuprofen (OTC), but H/A pain is increasing - rated 4/10 / requests Vicodin (no anaphylaxis with Hydrocodone)             Up ad lib / ambulatory / voiding without difficulties    Newborn  Information for the patient's newborn:  Orah, Sonnen [[id number]]  female  breast feeding     O:  A & O x 3, in no apparent distress              VS:  Filed Vitals:   10/25/15 1700 10/25/15 1819 10/25/15 2111 10/26/15 0700  BP: 116/62 118/71 127/50 119/69  Pulse: 74 82 72 80  Temp: 97.9 F (36.6 C) 98.1 F (36.7 C) 97.6 F (36.4 C) 98 F (36.7 C)  TempSrc: Oral Oral Oral   Resp: Height:      Weight:      SpO2:        LABS:  Recent Labs  10/25/15 0845 10/26/15 0515  WBC 7.5 6.9  HGB 8.5* 7.1*  HCT 27.2* 22.7*  PLT 146* 116*    Blood type: O NEG (01/26 0845) / Infant Rh NEG / Rhophylac NOT indicated  Rubella: Nonimmune (06/30 0000)     Lungs: Clear and unlabored  Heart: regular rate and rhythm / no murmurs  Abdomen: soft, non-tender, non-distended             Fundus: firm, non-tender, U-1  Perineum: 2nd degree repair healing  Lochia: minimal  Extremities: No edema, no calf pain or tenderness, No Homans    A/P: PPD # 1  40 y.o., [id number]   Principal Problem:    Postpartum care following vaginal delivery (1/26)  Active Problems:    Diabetes in pregnancy - delivered    Maternal iron deficiency anemia    Acute blood loss anemia  Epidural induced headache, postpartum   Doing well - stable status  Routine post partum orders   Anesthesia to perform blood patch in PACU this AM.  Anticipate discharge tomorrow    Raelyn Mora, M, MSN, CNM 10/26/2015, 9:32 AM

## 2015-10-26 NOTE — Anesthesia Preprocedure Evaluation (Addendum)
Anesthesia Evaluation  Patient identified by MRN, date of birth, ID band Patient awake    Reviewed: Allergy & Precautions, H&P , NPO status , Patient's Chart, lab work & pertinent test results  Airway Mallampati: I  TM Distance: >3 FB Neck ROM: full    Dental no notable dental hx.    Pulmonary neg pulmonary ROS,    Pulmonary exam normal        Cardiovascular negative cardio ROS Normal cardiovascular exam     Neuro/Psych negative neurological ROS  negative psych ROS   GI/Hepatic negative GI ROS, Neg liver ROS,   Endo/Other  diabetes  Renal/GU negative Renal ROS     Musculoskeletal   Abdominal (+) + obese,   Peds  Hematology negative hematology ROS (+)   Anesthesia Other Findings   Reproductive/Obstetrics (+) Pregnancy                            Anesthesia Physical  Anesthesia Plan  ASA: II  Anesthesia Plan: Epidural   Post-op Pain Management:    Induction:   Airway Management Planned:   Additional Equipment:   Intra-op Plan:   Post-operative Plan:   Informed Consent: I have reviewed the patients History and Physical, chart, labs and discussed the procedure including the risks, benefits and alternatives for the proposed anesthesia with the patient or authorized representative who has indicated his/her understanding and acceptance.     Plan Discussed with:   Anesthesia Plan Comments: (Pt is coming for epidural blood patch s/p wet tap yesterday)        Anesthesia Quick Evaluation

## 2015-10-26 NOTE — Anesthesia Postprocedure Evaluation (Addendum)
Anesthesia Post Note  Patient: Stacey Proctor  Procedure(s) Performed: * No procedures listed *  Patient location during evaluation: PACU Anesthesia Type: Epidural Level of consciousness: awake Pain management: pain level not controlled Vital Signs Assessment: post-procedure vital signs reviewed and stable Respiratory status: spontaneous breathing Cardiovascular status: stable Anesthetic complications: no Comments: Patient with only minimal response to EBP. Will proceed with one gram of caffeine sodium benzoate in one liter of LR over one hour. It is now 1620 and the patient is sitting up visibly better with a pain score of after the caffeine infusion. She will stay overnight and go home in the morning. Dr. Billy Coast is fully aware of the events of today.    Last Vitals:  Filed Vitals:   10/26/15 1315 10/26/15 1330  BP: 113/70 119/70  Pulse: 77 89  Temp:    Resp:      Last Pain:  Filed Vitals:   10/26/15 1336  PainSc: 5                  Philisha Weinel JR,JOHN Winnell Bento

## 2015-10-27 LAB — CBC
HCT: 26.3 % — ABNORMAL LOW (ref 36.0–46.0)
Hemoglobin: 7.9 g/dL — ABNORMAL LOW (ref 12.0–15.0)
MCH: 22.8 pg — ABNORMAL LOW (ref 26.0–34.0)
MCHC: 30 g/dL (ref 30.0–36.0)
MCV: 75.8 fL — ABNORMAL LOW (ref 78.0–100.0)
Platelets: 144 10*3/uL — ABNORMAL LOW (ref 150–400)
RBC: 3.47 MIL/uL — ABNORMAL LOW (ref 3.87–5.11)
RDW: 16.7 % — ABNORMAL HIGH (ref 11.5–15.5)
WBC: 7.3 10*3/uL (ref 4.0–10.5)

## 2015-10-27 LAB — COMPREHENSIVE METABOLIC PANEL
ALT: 14 U/L (ref 14–54)
AST: 24 U/L (ref 15–41)
Albumin: 2.7 g/dL — ABNORMAL LOW (ref 3.5–5.0)
Alkaline Phosphatase: 89 U/L (ref 38–126)
Anion gap: 9 (ref 5–15)
BUN: <5 mg/dL — ABNORMAL LOW (ref 6–20)
CO2: 23 mmol/L (ref 22–32)
Calcium: 8.4 mg/dL — ABNORMAL LOW (ref 8.9–10.3)
Chloride: 107 mmol/L (ref 101–111)
Creatinine, Ser: 0.71 mg/dL (ref 0.44–1.00)
GFR calc Af Amer: >60 mL/min (ref >60)
GFR calc non Af Amer: >60 mL/min (ref >60)
Glucose, Bld: 138 mg/dL — ABNORMAL HIGH (ref 65–99)
Potassium: 3.5 mmol/L (ref 3.5–5.1)
Sodium: 139 mmol/L (ref 135–145)
Total Bilirubin: 0.4 mg/dL (ref 0.3–1.2)
Total Protein: 6 g/dL — ABNORMAL LOW (ref 6.5–8.1)

## 2015-10-27 LAB — FERRITIN: Ferritin: 5 ng/mL — ABNORMAL LOW (ref 11–307)

## 2015-10-27 MED ORDER — CYCLOBENZAPRINE HCL 10 MG PO TABS
10.0000 mg | ORAL_TABLET | Freq: Three times a day (TID) | ORAL | Status: DC | PRN
  Administered 2015-10-27 – 2015-10-28 (×3): 10 mg via ORAL
  Filled 2015-10-27 (×5): qty 1

## 2015-10-27 MED ORDER — BUTALBITAL-APAP-CAFFEINE 50-325-40 MG PO TABS
2.0000 | ORAL_TABLET | ORAL | Status: DC | PRN
  Administered 2015-10-27: 2 via ORAL
  Filled 2015-10-27: qty 2

## 2015-10-27 MED ORDER — LACTATED RINGERS IV SOLN
INTRAVENOUS | Status: DC
  Administered 2015-10-27 – 2015-10-28 (×3): via INTRAVENOUS

## 2015-10-27 MED ORDER — MAGNESIUM OXIDE 400 (241.3 MG) MG PO TABS
400.0000 mg | ORAL_TABLET | Freq: Every day | ORAL | Status: DC
  Administered 2015-10-28 – 2015-10-29 (×2): 400 mg via ORAL
  Filled 2015-10-27 (×2): qty 1

## 2015-10-27 NOTE — Progress Notes (Addendum)
POSTPARTUM DAY # 2 S/P SVD / POST-PROCEDURE DAY #1 - blood patch for spinal headache  S:         Reports feeling poorly - feels terrible -reports head throbs and very dizzy if stands up             Tolerating po intake / + nausea with standing and headache / no vomiting / no flatus / no BM             Bleeding is very light             Pain marginally controlled with motrin, vicodin - reports headache 7/10 persistent              Up ad lib / ambulatory/ voiding QS  HX severe anemia since childhood - iron infusions x 3 in past (last after birth of 2nd child)  O:  VS: BP 111/65 mmHg  Pulse 63  Temp(Src) 98.2 F (36.8 C) (Oral)  Resp 17  Ht _0  (1.549 m)  Wt 78.019 kg (172 lb)  BMI 32.52 kg/m2  SpO2 100%  LMP 01/25/2015  Breastfeeding? Unknown   LABS:               Recent Labs  10/25/15 0845 10/26/15 0515  WBC 7.5 6.9  HGB 8.5* 7.1*  PLT 146* 116*               Bloodtype: --/--/O NEG (01/26 0845)  / newborn RH negative  Rubella: Nonimmune (06/30 0000)  - MMR ordered                                      I&O: net + 2642             Physical Exam:             Alert and Oriented X3  Lungs: Clear and unlabored  Heart: regular rate @ 90 / normal rhythm / no mumurs  Abdomen: soft, non-tender, non-distended              Fundus: firm, non-tender, Ueven       Perineum: mild edema  Lochia: scant  Extremities: trace edema, no calf pain or tenderness, negative Homans  A:        PPD # 2 S/P SVD / PP # 1 s/p blood patch for spinal headache            Chronic anemia compounded by IDA of pregnancy and blood loss with NVD            Suspect hypovolemia & anemia contributing to headache & malaise            Spinal headache without resolution from blood patch            Not ready for discharge today - severe persistent headache and dizziness  P:        Routine postpartum care              Repeat cbc and check ferritin - consider transfusion / iron infusion             Check  orthostatic vital signs - continue IVF for now at 125 / encouraged PO intake             Fioricet for headache - contains caffeine / flexeril & narcotic med per anesthesia at DC  Consult Dr Ronita Hipps after labs and orthostatics reviewed    Artelia Laroche CNM, MSN, [facility] 10/27/2015, 9:13 AM

## 2015-10-27 NOTE — Progress Notes (Signed)
Pt started complaining of a gradual headache at 2100 and one vicodin was given. At 2230 the headache had worsened to where pt had to lay down and have all the lights off in the room.  Dr. Ivin Booty was notified and he ordered a bolus of LR and then LR running at 150/hr for 4 hours.  Dr. Ivin Booty also recommended a second vicodin be given.  After the bolus and vicodin were given the pt's headache started to subside.

## 2015-10-27 NOTE — Progress Notes (Signed)
Review status   VS: BP 130/78 mmHg  Pulse 128  Temp(Src) 97.7 F (36.5 C) (Axillary)  Resp 18  Ht  (1.549 m)  Wt 78.019 kg (172 lb)  BMI 32.52 kg/m2  SpO2 100%  LMP 01/25/2015  Breastfeeding? Unknown  Orthostatic VS reviewed: Pulse 63 - 81 - 128 BP 129/73 - 123/71 - 130/78  LABS:              Recent Labs  10/26/15 0515 10/27/15 1117  WBC 6.9 7.3  HGB 7.1* 7.9*  PLT 116* 144*                I&O: Intake/Output      01/27 0701 - 01/28 0700 01/28 0701 - 01/29 0700   P.O. 240    I.V. (mL/kg) 1657.5 (21.2)    IV Piggyback 1000    Total Intake(mL/kg) 2897.5 (37.1)    Blood     Total Output       Net +2897.5          Urine Occurrence 1 x      A: PPD # 2             Chronic anemia             Mild hypovolemia  P: Continue IVF thru the night             Fiorcet and Flexeril PRN pain/ headache   Marlinda Mike CNM, MSN, FACNM 10/27/2015, 4:02 PM

## 2015-10-28 MED ORDER — VITAMIN C 500 MG PO TABS
500.0000 mg | ORAL_TABLET | Freq: Two times a day (BID) | ORAL | Status: DC
  Administered 2015-10-28 – 2015-10-29 (×3): 500 mg via ORAL
  Filled 2015-10-28 (×4): qty 1

## 2015-10-28 MED ORDER — POLYETHYLENE GLYCOL 3350 17 G PO PACK
17.0000 g | PACK | Freq: Once | ORAL | Status: AC
  Administered 2015-10-28: 17 g via ORAL
  Filled 2015-10-28: qty 1

## 2015-10-28 MED ORDER — SODIUM CHLORIDE 0.9 % IV SOLN
510.0000 mg | Freq: Once | INTRAVENOUS | Status: AC
  Administered 2015-10-28: 510 mg via INTRAVENOUS
  Filled 2015-10-28: qty 17

## 2015-10-28 NOTE — Progress Notes (Signed)
Updated status report   Nurse reports - patient not ambulatory yet / up to bathroom ad lib but no other activity Headache resolved completely today - just reports being tired Feels "fuzzy in head" but no reports no dizziness / fearful will pass out  - told not to walk by anensthesia Patient and family adamant that she should not go home yet - not able to care for herself noting not even showered yet     VS: BP 121/63 mmHg  Pulse 76  Temp(Src) 99 F (37.2 C) (Oral)  Resp 18  Ht  (1.549 m)  Wt 78.019 kg (172 lb)  BMI 32.52 kg/m2  SpO2 100%  LMP 01/25/2015  Breastfeeding? Unknown  IV iron dose completed   A: PPD # 3  Chronic anemia - stable after iron transfusion and IVF x 24+ hours             S/P blood patch after spinal headache - headache resolved  P: routine post partum orders  orthostics VS - if stable without tachycardia needs to shower and ambulate in halls             Reminded activity restriction was only due spinal headache - need to ambulate now that headache resolved             Miralax for BM - continue oral iron and magnesium with vitamin C supplements             DC in AM if VS remain stable and ambulatory without syncope  Marlinda Mike CNM, MSN, [facility] 10/28/2015, 2:40 PM

## 2015-10-28 NOTE — Progress Notes (Signed)
PPD 3 SVD / s/p #2 blood patch  S:  reports feeling much better today - headache better                  feels very weak and tired & takes enormous amount of energy to just get out of bed                  uncertain if she will be able to go home with intense fatigue                  asking about iron infusion prior to DC (previously PP had to return for iron infusion)             tolerating po/ No nausea or vomiting             bleeding is spotting & very light             pain controlled withMotrin, Fiorcet, flexeril, Percocet             up ad lib with ambulatory only to bathroom - too weak to walk halls / voiding QS  newborn breast feeding   O:               VS: BP 112/69 mmHg  Pulse 68  Temp(Src) 97.7 F (36.5 C) (Oral)  Resp 18  Ht 5' 1"  (1.549 m)  Wt 78.019 kg (172 lb)  BMI 32.52 kg/m2  SpO2 100%  LMP 01/25/2015  Breastfeeding? Unknown   LABS:              Recent Labs  10/26/15 0515 10/27/15 1117  WBC 6.9 7.3  HGB 7.1* 7.9*  PLT 116* 144*               Blood type: --/--/O NEG (01/26 0845)  Rubella: Nonimmune (06/30 0000)  - MMR ordered                      Physical Exam:             Alert and oriented X3  Lungs: Clear and unlabored  Heart: regular rate without tachycardia and rhythm normal / no mumurs  Abdomen: soft, non-tender, non-distended              Fundus: firm, non-tender, U-1  Perineum: no edema  Lochia: light  Extremities: trace edema, no calf pain or tenderness    A: PPD # 3 SVD              Post-procedure #2 s/p blood patch - improved headache             Chronic iron deficiency anemia  (ferritin 5) - reports weakness and fatigue             Improved hydration - resolved mild hypovolemia  Doing well - stable status  P: Routine post partum orders  Consult with Dr Ronita Hipps for iron infusion prior to DC                      give IV iron dose today & continue IVF until after IV iron given             Re-evaluate DC status this afternoon            reminder at DC will send home with iron and magnesium and vitamin C BID - lab in office 2 weeks PP  BAILEY, TANYA CNM, MSN,  [place] 10/28/2015, 8:00 AM

## 2015-10-28 NOTE — Lactation Note (Signed)
This note was copied from the chart of [name]. Lactation Consultation Note  Mother's breasts are filling. Denies questions or problems with breastfeeding. Reviewed engorgement care and monitoring voids/stools.   Patient Name: [name] [id number] Date: 10/28/2015 Reason for consult: Follow-up assessment   Maternal Data    Feeding Feeding Type: Breast Fed Length of feed: 10 min  LATCH Score/Interventions Latch: Grasps breast easily, tongue down, lips flanged, rhythmical sucking.  Audible Swallowing: Spontaneous and intermittent  Type of Nipple: Everted at rest and after stimulation  Comfort (Breast/Nipple): Filling, red/small blisters or bruises, mild/mod discomfort  Problem noted: Mild/Moderate discomfort;Filling Interventions (Mild/moderate discomfort):  (EBM, olive or coconut oil instead of lanolin)  Hold (Positioning): No assistance needed to correctly position infant at breast.  LATCH Score: 9  Lactation Tools Discussed/Used     Consult Status Consult Status: Complete    Hardie Pulley 10/28/2015, 1:18 PM

## 2015-10-29 MED ORDER — IBUPROFEN 600 MG PO TABS: 600.0000 mg | ORAL_TABLET | Freq: Four times a day (QID) | ORAL | Status: DC

## 2015-10-29 MED ORDER — HYDROCODONE-ACETAMINOPHEN 5-325 MG PO TABS: 1.0000 | ORAL_TABLET | ORAL | Status: DC | PRN

## 2015-10-29 MED ORDER — MAGIC MOUTHWASH: 5.0000 mL | Freq: Three times a day (TID) | ORAL | Status: DC

## 2015-10-29 MED ORDER — CYCLOBENZAPRINE HCL 10 MG PO TABS: 10.0000 mg | ORAL_TABLET | Freq: Three times a day (TID) | ORAL | Status: DC | PRN

## 2015-10-29 MED ORDER — MAGNESIUM OXIDE 400 (241.3 MG) MG PO TABS: 400.0000 mg | ORAL_TABLET | Freq: Every day | ORAL | Status: DC

## 2015-10-29 MED ORDER — ASCORBIC ACID 500 MG PO TABS: 500.0000 mg | ORAL_TABLET | Freq: Two times a day (BID) | ORAL | Status: DC

## 2015-10-29 MED ORDER — POLYSACCHARIDE IRON COMPLEX 150 MG PO CAPS: 150.0000 mg | ORAL_CAPSULE | Freq: Two times a day (BID) | ORAL | Status: DC

## 2015-10-29 MED ORDER — BUTALBITAL-APAP-CAFFEINE 50-325-40 MG PO TABS: 2.0000 | ORAL_TABLET | ORAL | Status: DC | PRN

## 2015-10-29 NOTE — Progress Notes (Signed)
PPD #4 SVD / S/P #3 Blood Patch  S:  reports feeling much better today - headache better                  able to get in & out of bed without difficulty                  feels like she did on PPD 1 - feels ready to go home after being able to stay last night             tolerating po/ No nausea or vomiting / reports sore patches on tongue - "this happened before with iron infusion"              bleeding is spotting & very light             pain controlled with Motrin, Fiorcet, Flexeril, Percocet             up ad lib with ambulatory to bathroom and around room / voiding QS  newborn breast feeding without diffculty  O:               VS: BP 114/76 mmHg  Pulse 72  Temp(Src) 97.8 F (36.6 C) (Oral)  Resp 18  Ht 5' 1"  (1.549 m)  Wt 78.019 kg (172 lb)  BMI 32.52 kg/m2  SpO2 98%  LMP 01/25/2015  Breastfeeding   LABS:               Recent Labs  10/27/15 1117  WBC 7.3  HGB 7.9*  PLT 144*               Blood type: --/--/O NEG (01/26 0845) / Infant Rh NEG - Rhoohylac NOT indicated  Rubella: Nonimmune (06/30 0000)  - MMR needed prior to d/c home                      Physical Exam:             Alert and oriented X3  Lungs: Clear and unlabored  Heart: regular rate without tachycardia and rhythm normal / no mumurs  Abdomen: soft, non-tender, non-distended              Fundus: firm, non-tender, U-3  Perineum: no edema  Lochia: scant  Extremities: trace edema, no calf pain or tenderness    A: PPD # 4 SVD              Post-procedure #3 S/P blood patch - improved headache             Chronic iron deficiency anemia  (ferritin 5)             Improved hydration - resolved mild hypovolemia  Doing well - stable status  P: Routine post partum orders  D/C home today  Rx for home: PNV, iron, magnesium, vitamin C BID, flexeril, fioricet, and magic mouthwash - lab in office 2             weeks PP  Laury Deep, Jerilynn Mages MSN, CNM  10/29/2015, 8:58 AM

## 2015-10-29 NOTE — Discharge Summary (Signed)
Patient ID: Stacey Proctor MRN: [id number] DOB/AGE: 1975/10/20 40 y.o.  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 Billy Coast Prenatal course complicated by chronic iron deficiency anemia / Class A2GDM / HSV / H/O Tubal reversal / H/O LEEP / LGSIL  Prenatal Labs: ABO, Rh: --/--/O NEG (01/26 0845) Antibody: POS (01/26 0845) Rubella: Nonimmune (06/30 0000)  RPR: Non Reactive (01/26 0845)  HBsAg: Negative (06/30 0000)  HIV: Non-reactive (06/30 0000)  GTT: Abnormal GBS: Negative (01/06 0000)   Medical / Surgical History :  Past medical history:  Past Medical History  Diagnosis Date  . Medical history non-contributory   . Gestational diabetes mellitus, antepartum   . Herpes   . Abnormal Pap smear   . Postpartum care following vaginal delivery (1/12) 10/10/2013  . Vaginal Pap smear, abnormal   . Gestational diabetes   . Maternal iron deficiency anemia 10/26/2015  . Acute blood loss anemia 10/26/2015  . Epidural anesthesia-induced headache during puerperium 10/26/2015    Past surgical history:  Past Surgical History  Procedure Laterality Date  . Tubal ligation    . Tubal reversal  09/24/2012  . Wisdom tooth extraction    . Leep      Family History:  Family History  Problem Relation Age of Onset  . Transient ischemic attack Mother   . Crohn's disease Mother   . Rheum arthritis Mother   . Seizures Mother   . Diabetes Father   . Heart disease Father   . Hypertension Father   . Heart attack Father   . Rheum arthritis Father   . Heart disease Maternal Grandmother   . Diabetes Maternal Grandfather   . Diabetes Paternal Grandmother   . Cancer Paternal Grandfather   . Heart attack Paternal  Grandfather   . Hypertension Paternal Grandfather   . Emphysema Paternal Grandfather   . Birth defects Son     polydactyl    Social History:  reports that she has never smoked. She quit smokeless tobacco use about 3 years ago. She reports that she drinks alcohol. She reports that she does not use illicit drugs.  Allergies: Codeine; Penicillins; Sulfa antibiotics; and Lyrica   Current Medications at time of admission:  Prior to Admission medications   Medication Sig Start Date End Date Taking? Authorizing Provider  glyBURIDE (DIABETA) 1.25 MG tablet Take 1.25 mg by mouth 3 (three) times daily. Pt takes 3 tablets in the AM, and 1 tablet at lunch, and 1 tablet at bedtime.   Yes Historical Provider, MD  Prenatal Vit-Fe Fumarate-FA (PRENATAL MULTIVITAMIN) TABS Take 1 tablet by mouth daily at 12 noon.   Yes Historical Provider, MD  valACYclovir (VALTREX) 500 MG tablet Take 500 mg by mouth 2 (two) times daily.   Yes Historical Provider, MD  butalbital-acetaminophen-caffeine (FIORICET, ESGIC) 50-325-40 MG tablet Take 2 tablets by mouth every 4 (four) hours as needed for headache (spinal headache). 10/29/15   Raelyn Mora, CNM  cyclobenzaprine (FLEXERIL) 10 MG tablet Take 1 tablet (10 mg total) by mouth 3 (three) times daily as needed for muscle spasms. 10/29/15   Raelyn Mora, CNM  HYDROcodone-acetaminophen (NORCO/VICODIN) 5-325 MG tablet Take 1-2 tablets by mouth every 4 (four) hours as needed for moderate pain  or severe pain. 10/29/15   Nanako Stopher Arita Miss, CNM  ibuprofen (ADVIL,MOTRIN) 600 MG tablet Take 1 tablet (600 mg total) by mouth every 6 (six) hours. 10/29/15   Raelyn Mora, CNM  iron polysaccharides (NIFEREX) 150 MG capsule Take 1 capsule (150 mg total) by mouth 2 (two) times daily. 10/29/15   Raelyn Mora, CNM  magic mouthwash SOLN Take 5 mLs by mouth 3 (three) times daily. 10/29/15   Raelyn Mora, CNM  magnesium oxide (MAG-OX) 400 (241.3 Mg) MG tablet Take 1 tablet (400 mg total) by mouth  daily. 10/29/15   Raelyn Mora, CNM  vitamin C (VITAMIN C) 500 MG tablet Take 1 tablet (500 mg total) by mouth 2 (two) times daily. 10/29/15   Raelyn Mora, [facility] Course:  Admitted for IOL for Class A2GDM / favorable cervix / Pitocin / AROM / epidural for pain management / rapid progression to complete dilation / SVD of viable female with 2nd degree repair by Dr. Billy Coast / Postpartum course: c/o "really headache" on PPD 1 with subsequent blood patch performed by anesthesia for spinal headache / extreme weakness and fatigue PPD 2 - not stable for d/c home / mild hypovolemia - treated with IVF / ferritin level 5 - IV iron infusion / PPD 3 despite improvement in sx's patient requests to "stay another night - unable to go home being so weak" / PPD 4 ambulatory without assistance, minimal complaints - stable for d/c today    Discharge Instructions:  Discharged Condition: stable  Activity: pelvic rest  Diet: routine  Medications: Ibuprofen, Iron, Vicodin and magnesium oxide, fioricet, flexiril, vitamin C, magic mouthwash   Medication List    TAKE these medications        ascorbic acid 500 MG tablet  Commonly known as:  VITAMIN C  Take 1 tablet (500 mg total) by mouth 2 (two) times daily.     butalbital-acetaminophen-caffeine 50-325-40 MG tablet  Commonly known as:  FIORICET, ESGIC  Take 2 tablets by mouth every 4 (four) hours as needed for headache (spinal headache).     cyclobenzaprine 10 MG tablet  Commonly known as:  FLEXERIL  Take 1 tablet (10 mg total) by mouth 3 (three) times daily as needed for muscle spasms.     glyBURIDE 1.25 MG tablet  Commonly known as:  DIABETA  Take 1.25 mg by mouth 3 (three) times daily. Pt takes 3 tablets in the AM, and 1 tablet at lunch, and 1 tablet at bedtime.     HYDROcodone-acetaminophen 5-325 MG tablet  Commonly known as:  NORCO/VICODIN  Take 1-2 tablets by mouth every 4 (four) hours as needed for moderate pain or severe pain.      ibuprofen 600 MG tablet  Commonly known as:  ADVIL,MOTRIN  Take 1 tablet (600 mg total) by mouth every 6 (six) hours.     iron polysaccharides 150 MG capsule  Commonly known as:  NIFEREX  Take 1 capsule (150 mg total) by mouth 2 (two) times daily.     magic mouthwash Soln  Take 5 mLs by mouth 3 (three) times daily.     magnesium oxide 400 (241.3 Mg) MG tablet  Commonly known as:  MAG-OX  Take 1 tablet (400 mg total) by mouth daily.     prenatal multivitamin Tabs tablet  Take 1 tablet by mouth daily at 12 noon.     valACYclovir 500 MG tablet  Commonly known as:  VALTREX  Take 500 mg by mouth 2 (two) times  daily.        Discharge Instructions: Breastfeeding, Breast pumping tips, postpartum vaginal care, postpartum blues/depression  Discharge to: Home  Follow up :      Follow-up Information    Follow up with Lenoard Aden, MD. Schedule an appointment as soon as possible for a visit in 2 weeks.   Specialty:  Obstetrics and Gynecology   Why:  blood work (CBC)   Contact information:   [address] [place] [address] [phone]       Follow up with Lenoard Aden, MD. Schedule an appointment as soon as possible for a visit in 6 weeks.   Specialty:  Obstetrics and Gynecology   Why:  postpartum visit   Contact information:   [address] [place] [address] [phone]        Signed: Kenard Gower, CNM, MSN, [facility] 10/29/2015, 9:52 AM

## 2015-10-29 NOTE — Discharge Instructions (Signed)
Breast Pumping Tips °If you are breastfeeding, there may be times when you cannot feed your baby directly. Returning to work or going on a trip are common examples. Pumping allows you to store breast milk and feed it to your baby later.  °You may not get much milk when you first start to pump. Your breasts should start to make more after a few days. If you pump at the times you usually feed your baby, you may be able to keep making enough milk to feed your baby without also using formula. The more often you pump, the more milk you will produce.  °WHEN SHOULD I PUMP?  °· You can begin to pump soon after delivery. However, some experts recommend waiting about 4 weeks before giving your infant a bottle to make sure breastfeeding is going well.  °· If you plan to return to work, begin pumping a few weeks before. This will help you develop techniques that work best for you. It also lets you build up a supply of breast milk.   °· When you are with your infant, feed on demand and pump after each feeding.   °· When you are away from your infant for several hours, pump for about 15 minutes every 2-3 hours. Pump both breasts at the same time if you can.   °· If your infant has a formula feeding, make sure to pump around the same time.     °· If you drink any alcohol, wait 2 hours before pumping.   °HOW DO I PREPARE TO PUMP? °Your let-down reflex is the natural reaction to stimulation that makes your breast milk flow. It is easier to stimulate this reflex when you are relaxed. Find relaxation techniques that work for you. If you have difficulty with your let-down reflex, try these methods:  °· Smell one of your infant's blankets or an item of clothing.   °· Look at a picture or video of your infant.   °· Sit in a quiet, private space.   °· Massage the breast you plan to pump.   °· Place soothing warmth on the breast.   °· Play relaxing music.   °WHAT ARE SOME GENERAL BREAST PUMPING TIPS? °· Wash your hands before you pump. You  do not need to wash your nipples or breasts. °· There are three ways to pump. °¨ You can use your hand to massage and compress your breast. °¨ You can use a handheld manual pump. °¨ You can use an electric pump.   °· Make sure the suction cup (flange) on the breast pump is the right size. Place the flange directly over the nipple. If it is the wrong size or placed the wrong way, it may be painful and cause nipple damage.   °· If pumping is uncomfortable, apply a small amount of purified or modified lanolin to your nipple and areola. °· If you are using an electric pump, adjust the speed and suction power to be more comfortable. °· If pumping is painful or if you are not getting very much milk, you may need a different type of pump. A lactation consultant can help you determine what type of pump to use.   °· Keep a full water bottle near you at all times. Drinking lots of fluid helps you make more milk.  °· You can store your milk to use later. Pumped breast milk can be stored in a sealable, sterile container or plastic bag. Label all stored breast milk with the date you pumped it. °¨ Milk can stay out at room temperature for up to 8 hours. °¨   You can store your milk in the refrigerator for up to 8 days. °¨ You can store your milk in the freezer for 3 months. Thaw frozen milk using warm water. Do not put it in the microwave. °· Do not smoke. Smoking can lower your milk supply and harm your infant. If you need help quitting, ask your health care provider to recommend a program.   °WHEN SHOULD I CALL MY HEALTH CARE PROVIDER OR A LACTATION CONSULTANT? °· You are having trouble pumping. °· You are concerned that you are not making enough milk. °· You have nipple pain, soreness, or redness. °· You want to use birth control. Birth control pills may lower your milk supply. Talk to your health care provider about your options. °  °This information is not intended to replace advice given to you by your health care provider.  Make sure you discuss any questions you have with your health care provider. °  °Document Released: 03/05/2010 Document Revised: 09/20/2013 Document Reviewed: 07/08/2013 °[employer]vier Interactive Patient Education ©2016 [employer] °Postpartum Depression and Baby Blues °The postpartum period begins right after the birth of a baby. During this time, there is often a great amount of joy and excitement. It is also a time of many changes in the life of the parents. Regardless of how many times a mother gives birth, each child brings new challenges and dynamics to the family. It is not unusual to have feelings of excitement along with confusing shifts in moods, emotions, and thoughts. All mothers are at risk of developing postpartum depression or the "baby blues." These mood changes can occur right after giving birth, or they may occur many months after giving birth. The baby blues or postpartum depression can be mild or severe. Additionally, postpartum depression can go away rather quickly, or it can be a long-term condition.  °CAUSES °Raised hormone levels and the rapid drop in those levels are thought to be a main cause of postpartum depression and the baby blues. A number of hormones change during and after pregnancy. Estrogen and progesterone usually decrease right after the delivery of your baby. The levels of thyroid hormone and various cortisol steroids also rapidly drop. Other factors that play a role in these mood changes include major life events and genetics.  °RISK FACTORS °If you have any of the following risks for the baby blues or postpartum depression, know what symptoms to watch out for during the postpartum period. Risk factors that may increase the likelihood of getting the baby blues or postpartum depression include: °· Having a personal or family history of depression.   °· Having depression while being pregnant.   °· Having premenstrual mood issues or mood issues related to oral  contraceptives. °· Having a lot of life stress.   °· Having marital conflict.   °· Lacking a social support network.   °· Having a baby with special needs.   °· Having health problems, such as diabetes.   °SIGNS AND SYMPTOMS °Symptoms of baby blues include: °· Brief changes in mood, such as going from extreme happiness to sadness. °· Decreased concentration.   °· Difficulty sleeping.   °· Crying spells, tearfulness.   °· Irritability.   °· Anxiety.   °Symptoms of postpartum depression typically begin within the first month after giving birth. These symptoms include: °· Difficulty sleeping or excessive sleepiness.   °· Marked weight loss.   °· Agitation.   °· Feelings of worthlessness.   °· Lack of interest in activity or food.   °Postpartum psychosis is a very serious condition and can be dangerous. Fortunately, it is   rare. Displaying any of the following symptoms is cause for immediate medical attention. Symptoms of postpartum psychosis include:  °· Hallucinations and delusions.   °· Bizarre or disorganized behavior.   °· Confusion or disorientation.   °DIAGNOSIS  °A diagnosis is made by an evaluation of your symptoms. There are no medical or lab tests that lead to a diagnosis, but there are various questionnaires that a health care provider may use to identify those with the baby blues, postpartum depression, or psychosis. Often, a screening tool called the Edinburgh Postnatal Depression Scale is used to diagnose depression in the postpartum period.  °TREATMENT °The baby blues usually goes away on its own in 1-2 weeks. Social support is often all that is needed. You will be encouraged to get adequate sleep and rest. Occasionally, you may be given medicines to help you sleep.  °Postpartum depression requires treatment because it can last several months or longer if it is not treated. Treatment may include individual or group therapy, medicine, or both to address any social, physiological, and psychological factors  that may play a role in the depression. Regular exercise, a healthy diet, rest, and social support may also be strongly recommended.  °Postpartum psychosis is more serious and needs treatment right away. Hospitalization is often needed. °HOME CARE INSTRUCTIONS °· Get as much rest as you can. Nap when the baby sleeps.   °· Exercise regularly. Some women find yoga and walking to be beneficial.   °· Eat a balanced and nourishing diet.   °· Do little things that you enjoy. Have a cup of tea, take a bubble bath, read your favorite magazine, or listen to your favorite music. °· Avoid alcohol.   °· Ask for help with household chores, cooking, grocery shopping, or running errands as needed. Do not try to do everything.   °· Talk to people close to you about how you are feeling. Get support from your partner, family members, friends, or other new moms. °· Try to stay positive in how you think. Think about the things you are grateful for.   °· Do not spend a lot of time alone.   °· Only take over-the-counter or prescription medicine as directed by your health care provider. °· Keep all your postpartum appointments.   °· Let your health care provider know if you have any concerns.   °SEEK MEDICAL CARE IF: °You are having a reaction to or problems with your medicine. °SEEK IMMEDIATE MEDICAL CARE IF: °· You have suicidal feelings.   °· You think you may harm the baby or someone else. °MAKE SURE YOU: °· Understand these instructions. °· Will watch your condition. °· Will get help right away if you are not doing well or get worse. °  °This information is not intended to replace advice given to you by your health care provider. Make sure you discuss any questions you have with your health care provider. °  °Document Released: 06/19/2004 Document Revised: 09/20/2013 Document Reviewed: 06/27/2013 °Elsevier Interactive Patient Education ©2016 [employer] °Postpartum Care After Vaginal Delivery °After you deliver your newborn  (postpartum period), the usual stay in the hospital is 24-72 hours. If there were problems with your labor or delivery, or if you have other medical problems, you might be in the hospital longer.  °While you are in the hospital, you will receive help and instructions on how to care for yourself and your newborn during the postpartum period.  °While you are in the hospital: °· Be sure to tell your nurses if you have pain or discomfort, as well as   where you feel the pain and what makes the pain worse. °· If you had an incision made near your vagina (episiotomy) or if you had some tearing during delivery, the nurses may put ice packs on your episiotomy or tear. The ice packs may help to reduce the pain and swelling. °· If you are breastfeeding, you may feel uncomfortable contractions of your uterus for a couple of weeks. This is normal. The contractions help your uterus get back to normal size. °· It is normal to have some bleeding after delivery. °¨ For the first 1-3 days after delivery, the flow is red and the amount may be similar to a period. °¨ It is common for the flow to start and stop. °¨ In the first few days, you may pass some small clots. Let your nurses know if you begin to pass large clots or your flow increases. °¨ Do not  flush blood clots down the toilet before having the nurse look at them. °¨ During the next 3-10 days after delivery, your flow should become more watery and pink or brown-tinged in color. °¨ Ten to fourteen days after delivery, your flow should be a small amount of yellowish-white discharge. °¨ The amount of your flow will decrease over the first few weeks after delivery. Your flow may stop in 6-8 weeks. Most women have had their flow stop by 12 weeks after delivery. °· You should change your sanitary pads frequently. °· Wash your hands thoroughly with soap and water for at least 20 seconds after changing pads, using the toilet, or before holding or feeding your newborn. °· You should  feel like you need to empty your bladder within the first 6-8 hours after delivery. °· In case you become weak, lightheaded, or faint, call your nurse before you get out of bed for the first time and before you take a shower for the first time. °· Within the first few days after delivery, your breasts may begin to feel tender and full. This is called engorgement. Breast tenderness usually goes away within 48-72 hours after engorgement occurs. You may also notice milk leaking from your breasts. If you are not breastfeeding, do not stimulate your breasts. Breast stimulation can make your breasts produce more milk. °· Spending as much time as possible with your newborn is very important. During this time, you and your newborn can feel close and get to know each other. Having your newborn stay in your room (rooming in) will help to strengthen the bond with your newborn.  It will give you time to get to know your newborn and become comfortable caring for your newborn. °· Your hormones change after delivery. Sometimes the hormone changes can temporarily cause you to feel sad or tearful. These feelings should not last more than a few days. If these feelings last longer than that, you should talk to your caregiver. °· If desired, talk to your caregiver about methods of family planning or contraception. °· Talk to your caregiver about immunizations. Your caregiver may want you to have the following immunizations before leaving the hospital: °¨ Tetanus, diphtheria, and pertussis (Tdap) or tetanus and diphtheria (Td) immunization. It is very important that you and your family (including grandparents) or others caring for your newborn are up-to-date with the Tdap or Td immunizations. The Tdap or Td immunization can help protect your newborn from getting ill. °¨ Rubella immunization. °¨ Varicella (chickenpox) immunization. °¨ Influenza immunization. You should receive this annual immunization if you did not receive the    immunization during your pregnancy. °  °This information is not intended to replace advice given to you by your health care provider. Make sure you discuss any questions you have with your health care provider. °  °Document Released: 07/13/2007 Document Revised: 06/09/2012 Document Reviewed: 05/12/2012 °[employer]vier Interactive Patient Education ©2016 [employer] °Breastfeeding and Mastitis °Mastitis is inflammation of the breast tissue. It can occur in women who are breastfeeding. This can make breastfeeding painful. Mastitis will sometimes go away on its own. Your health care provider will help determine if treatment is needed. °CAUSES °Mastitis is often associated with a blocked milk (lactiferous) duct. This can happen when too much milk builds up in the breast. Causes of excess milk in the breast can include: °· Poor latch-on. If your baby is not latched onto the breast properly, she or he may not empty your breast completely while breastfeeding. °· Allowing too much time to pass between feedings. °· Wearing a bra or other clothing that is too tight. This puts extra pressure on the lactiferous ducts so milk does not flow through them as it should. °Mastitis can also be caused by a bacterial infection. Bacteria may enter the breast tissue through cuts or openings in the skin. In women who are breastfeeding, this may occur because of cracked or irritated skin. Cracks in the skin are often caused when your baby does not latch on properly to the breast. °SIGNS AND SYMPTOMS °· Swelling, redness, tenderness, and pain in an area of the breast. °· Swelling of the glands under the arm on the same side. °· Fever may or may not accompany mastitis. °If an infection is allowed to progress, a collection of pus (abscess) may develop. °DIAGNOSIS  °Your health care provider can usually diagnose mastitis based on your symptoms and a physical exam. Tests may be done to help confirm the diagnosis. These may include: °· Removal of pus  from the breast by applying pressure to the area. This pus can be examined in the lab to determine which bacteria are present. If an abscess has developed, the fluid in the abscess can be removed with a needle. This can also be used to confirm the diagnosis and determine the bacteria present. In most cases, pus will not be present. °· Blood tests to determine if your body is fighting a bacterial infection. °· Mammogram or ultrasound tests to rule out other problems or diseases. °TREATMENT  °Mastitis that occurs with breastfeeding will sometimes go away on its own. Your health care provider may choose to wait 24 hours after first seeing you to decide whether a prescription medicine is needed. If your symptoms are worse after 24 hours, your health care provider will likely prescribe an antibiotic medicine to treat the mastitis. He or she will determine which bacteria are most likely causing the infection and will then select an appropriate antibiotic medicine. This is sometimes changed based on the results of tests performed to identify the bacteria, or if there is no response to the antibiotic medicine selected. Antibiotic medicines are usually given by mouth. You may also be given medicine for pain. °HOME CARE INSTRUCTIONS °· Only take over-the-counter or prescription medicines for pain, fever, or discomfort as directed by your health care provider. °· If your health care provider prescribed an antibiotic medicine, take the medicine as directed. Make sure you finish it even if you start to feel better. °· Do not wear a tight or underwire bra. Wear a soft, supportive bra. °· Increase your fluid   intake, especially if you have a fever. °· Continue to empty the breast. Your health care provider can tell you whether this milk is safe for your infant or needs to be thrown out. You may be told to stop nursing until your health care provider thinks it is safe for your baby. Use a breast pump if you are advised to stop  nursing. °· Keep your nipples clean and dry. °· Empty the first breast completely before going to the other breast. If your baby is not emptying your breasts completely for some reason, use a breast pump to empty your breasts. °· If you go back to work, pump your breasts while at work to stay in time with your nursing schedule. °· Avoid allowing your breasts to become overly filled with milk (engorged). °SEEK MEDICAL CARE IF: °· You have pus-like discharge from the breast. °· Your symptoms do not improve with the treatment prescribed by your health care provider within 2 days. °SEEK IMMEDIATE MEDICAL CARE IF: °· Your pain and swelling are getting worse. °· You have pain that is not controlled with medicine. °· You have a red line extending from the breast toward your armpit. °· You have a fever or persistent symptoms for more than 2-3 days. °· You have a fever and your symptoms suddenly get worse. °MAKE SURE YOU:  °· Understand these instructions. °· Will watch your condition. °· Will get help right away if you are not doing well or get worse. °  °This information is not intended to replace advice given to you by your health care provider. Make sure you discuss any questions you have with your health care provider. °  °Document Released: 01/10/2005 Document Revised: 09/20/2013 Document Reviewed: 04/21/2013 °[employer]vier Interactive Patient Education ©2016 [employer] ° °Breastfeeding °Deciding to breastfeed is one of the best choices you can make for you and your baby. A change in hormones during pregnancy causes your breast tissue to grow and increases the number and size of your milk ducts. These hormones also allow proteins, sugars, and fats from your blood supply to make breast milk in your milk-producing glands. Hormones prevent breast milk from being released before your baby is born as well as prompt milk flow after birth. Once breastfeeding has begun, thoughts of your baby, as well as his or her sucking or  crying, can stimulate the release of milk from your milk-producing glands.  °BENEFITS OF BREASTFEEDING °For Your Baby °· Your first milk (colostrum) helps your baby's digestive system function better. °· There are antibodies in your milk that help your baby fight off infections. °· Your baby has a lower incidence of asthma, allergies, and sudden infant death syndrome. °· The nutrients in breast milk are better for your baby than infant formulas and are designed uniquely for your baby's needs. °· Breast milk improves your baby's brain development. °· Your baby is less likely to develop other conditions, such as childhood obesity, asthma, or type 2 diabetes mellitus. °For You °· Breastfeeding helps to create a very special bond between you and your baby. °· Breastfeeding is convenient. Breast milk is always available at the correct temperature and costs nothing. °· Breastfeeding helps to burn calories and helps you lose the weight gained during pregnancy. °· Breastfeeding makes your uterus contract to its prepregnancy size faster and slows bleeding (lochia) after you give birth.   °· Breastfeeding helps to lower your risk of developing type 2 diabetes mellitus, osteoporosis, and breast or ovarian cancer later in life. °  SIGNS THAT YOUR BABY IS HUNGRY °Early Signs of Hunger °· Increased alertness or activity. °· Stretching. °· Movement of the head from side to side. °· Movement of the head and opening of the mouth when the corner of the mouth or cheek is stroked (rooting). °· Increased sucking sounds, smacking lips, cooing, sighing, or squeaking. °· Hand-to-mouth movements. °· Increased sucking of fingers or hands. °Late Signs of Hunger °· Fussing. °· Intermittent crying. °Extreme Signs of Hunger °Signs of extreme hunger will require calming and consoling before your baby will be able to breastfeed successfully. Do not wait for the following signs of extreme hunger to occur before you initiate  breastfeeding: °· Restlessness. °· A loud, strong cry. °· Screaming. °BREASTFEEDING BASICS °Breastfeeding Initiation °· Find a comfortable place to sit or lie down, with your neck and back well supported. °· Place a pillow or rolled up blanket under your baby to bring him or her to the level of your breast (if you are seated). Nursing pillows are specially designed to help support your arms and your baby while you breastfeed. °· Make sure that your baby's abdomen is facing your abdomen. °· Gently massage your breast. With your fingertips, massage from your chest wall toward your nipple in a circular motion. This encourages milk flow. You may need to continue this action during the feeding if your milk flows slowly. °· Support your breast with 4 fingers underneath and your thumb above your nipple. Make sure your fingers are well away from your nipple and your baby's mouth. °· Stroke your baby's lips gently with your finger or nipple. °· When your baby's mouth is open wide enough, quickly bring your baby to your breast, placing your entire nipple and as much of the colored area around your nipple (areola) as possible into your baby's mouth. °¨ More areola should be visible above your baby's upper lip than below the lower lip. °¨ Your baby's tongue should be between his or her lower gum and your breast. °· Ensure that your baby's mouth is correctly positioned around your nipple (latched). Your baby's lips should create a seal on your breast and be turned out (everted). °· It is common for your baby to suck about 2-3 minutes in order to start the flow of breast milk. °Latching °Teaching your baby how to latch on to your breast properly is very important. An improper latch can cause nipple pain and decreased milk supply for you and poor weight gain in your baby. Also, if your baby is not latched onto your nipple properly, he or she may swallow some air during feeding. This can make your baby fussy. Burping your baby when  you switch breasts during the feeding can help to get rid of the air. However, teaching your baby to latch on properly is still the best way to prevent fussiness from swallowing air while breastfeeding. °Signs that your baby has successfully latched on to your nipple: °· Silent tugging or silent sucking, without causing you pain. °· Swallowing heard between every 3-4 sucks. °· Muscle movement above and in front of his or her ears while sucking. °Signs that your baby has not successfully latched on to nipple: °· Sucking sounds or smacking sounds from your baby while breastfeeding. °· Nipple pain. °If you think your baby has not latched on correctly, slip your finger into the corner of your baby's mouth to break the suction and place it between your baby's gums. Attempt breastfeeding initiation again. °Signs of Successful Breastfeeding °  Signs from your baby: °· A gradual decrease in the number of sucks or complete cessation of sucking. °· Falling asleep. °· Relaxation of his or her body. °· Retention of a small amount of milk in his or her mouth. °· Letting go of your breast by himself or herself. °Signs from you: °· Breasts that have increased in firmness, weight, and size 1-3 hours after feeding. °· Breasts that are softer immediately after breastfeeding. °· Increased milk volume, as well as a change in milk consistency and color by the fifth day of breastfeeding. °· Nipples that are not sore, cracked, or bleeding. °Signs That Your Baby is Getting Enough Milk °· Wetting at least 3 diapers in a 24-hour period. The urine should be clear and pale yellow by age 5 days. °· At least 3 stools in a 24-hour period by age 5 days. The stool should be soft and yellow. °· At least 3 stools in a 24-hour period by age 7 days. The stool should be seedy and yellow. °· No loss of weight greater than 10% of birth weight during the first 3 days of age. °· Average weight gain of 4-7 ounces (113-198 g) per week after age 4  days. °· Consistent daily weight gain by age 5 days, without weight loss after the age of 2 weeks. °After a feeding, your baby may spit up a small amount. This is common. °BREASTFEEDING FREQUENCY AND DURATION °Frequent feeding will help you make more milk and can prevent sore nipples and breast engorgement. Breastfeed when you feel the need to reduce the fullness of your breasts or when your baby shows signs of hunger. This is called "breastfeeding on demand." Avoid introducing a pacifier to your baby while you are working to establish breastfeeding (the first 4-6 weeks after your baby is born). After this time you may choose to use a pacifier. Research has shown that pacifier use during the first year of a baby's life decreases the risk of sudden infant death syndrome (SIDS). °Allow your baby to feed on each breast as long as he or she wants. Breastfeed until your baby is finished feeding. When your baby unlatches or falls asleep while feeding from the first breast, offer the second breast. Because newborns are often sleepy in the first few weeks of life, you may need to awaken your baby to get him or her to feed. °Breastfeeding times will vary from baby to baby. However, the following rules can serve as a guide to help you ensure that your baby is properly fed: °· Newborns (babies 4 weeks of age or younger) may breastfeed every 1-3 hours. °· Newborns should not go longer than 3 hours during the day or 5 hours during the night without breastfeeding. °· You should breastfeed your baby a minimum of 8 times in a 24-hour period until you begin to introduce solid foods to your baby at around 6 months of age. °BREAST MILK PUMPING °Pumping and storing breast milk allows you to ensure that your baby is exclusively fed your breast milk, even at times when you are unable to breastfeed. This is especially important if you are going back to work while you are still breastfeeding or when you are not able to be present during  feedings. Your lactation consultant can give you guidelines on how long it is safe to store breast milk. °A breast pump is a machine that allows you to pump milk from your breast into a sterile bottle. The pumped breast milk can   then be stored in a refrigerator or freezer. Some breast pumps are operated by hand, while others use electricity. Ask your lactation consultant which type will work best for you. Breast pumps can be purchased, but some hospitals and breastfeeding support groups lease breast pumps on a monthly basis. A lactation consultant can teach you how to hand express breast milk, if you prefer not to use a pump. °CARING FOR YOUR BREASTS WHILE YOU BREASTFEED °Nipples can become dry, cracked, and sore while breastfeeding. The following recommendations can help keep your breasts moisturized and healthy: °· Avoid using soap on your nipples. °· Wear a supportive bra. Although not required, special nursing bras and tank tops are designed to allow access to your breasts for breastfeeding without taking off your entire bra or top. Avoid wearing underwire-style bras or extremely tight bras. °· Air dry your nipples for 3-4 minutes after each feeding. °· Use only cotton bra pads to absorb leaked breast milk. Leaking of breast milk between feedings is normal. °· Use lanolin on your nipples after breastfeeding. Lanolin helps to maintain your skin's normal moisture barrier. If you use pure lanolin, you do not need to wash it off before feeding your baby again. Pure lanolin is not toxic to your baby. You may also hand express a few drops of breast milk and gently massage that milk into your nipples and allow the milk to air dry. °In the first few weeks after giving birth, some women experience extremely full breasts (engorgement). Engorgement can make your breasts feel heavy, warm, and tender to the touch. Engorgement peaks within 3-5 days after you give birth. The following recommendations can help ease  engorgement: °· Completely empty your breasts while breastfeeding or pumping. You may want to start by applying warm, moist heat (in the shower or with warm water-soaked hand towels) just before feeding or pumping. This increases circulation and helps the milk flow. If your baby does not completely empty your breasts while breastfeeding, pump any extra milk after he or she is finished. °· Wear a snug bra (nursing or regular) or tank top for 1-2 days to signal your body to slightly decrease milk production. °· Apply ice packs to your breasts, unless this is too uncomfortable for you. °· Make sure that your baby is latched on and positioned properly while breastfeeding. °If engorgement persists after 48 hours of following these recommendations, contact your health care provider or a lactation consultant. °OVERALL HEALTH CARE RECOMMENDATIONS WHILE BREASTFEEDING °· Eat healthy foods. Alternate between meals and snacks, eating 3 of each per day. Because what you eat affects your breast milk, some of the foods may make your baby more irritable than usual. Avoid eating these foods if you are sure that they are negatively affecting your baby. °· Drink milk, fruit juice, and water to satisfy your thirst (about 10 glasses a day). °· Rest often, relax, and continue to take your prenatal vitamins to prevent fatigue, stress, and anemia. °· Continue breast self-awareness checks. °· Avoid chewing and smoking tobacco. Chemicals from cigarettes that pass into breast milk and exposure to secondhand smoke may harm your baby. °· Avoid alcohol and drug use, including marijuana. °Some medicines that may be harmful to your baby can pass through breast milk. It is important to ask your health care provider before taking any medicine, including all over-the-counter and prescription medicine as well as vitamin and herbal supplements. °It is possible to become pregnant while breastfeeding. If birth control is desired, ask your health care    provider about options that will be safe for your baby. °SEEK MEDICAL CARE IF: °· You feel like you want to stop breastfeeding or have become frustrated with breastfeeding. °· You have painful breasts or nipples. °· Your nipples are cracked or bleeding. °· Your breasts are red, tender, or warm. °· You have a swollen area on either breast. °· You have a fever or chills. °· You have nausea or vomiting. °· You have drainage other than breast milk from your nipples. °· Your breasts do not become full before feedings by the fifth day after you give birth. °· You feel sad and depressed. °· Your baby is too sleepy to eat well. °· Your baby is having trouble sleeping.   °· Your baby is wetting less than 3 diapers in a 24-hour period. °· Your baby has less than 3 stools in a 24-hour period. °· Your baby's skin or the white part of his or her eyes becomes yellow.   °· Your baby is not gaining weight by 5 days of age. °SEEK IMMEDIATE MEDICAL CARE IF: °· Your baby is overly tired (lethargic) and does not want to wake up and feed. °· Your baby develops an unexplained fever. °  °This information is not intended to replace advice given to you by your health care provider. Make sure you discuss any questions you have with your health care provider. °  °Document Released: 09/15/2005 Document Revised: 06/06/2015 Document Reviewed: 03/09/2013 °Elsevier Interactive Patient Education ©2016 [employer] ° °

## 2015-10-30 LAB — TYPE AND SCREEN
ABO/RH(D): O NEG
ANTIBODY SCREEN: POSITIVE
DAT, IgG: NEGATIVE
UNIT DIVISION: 0
UNIT DIVISION: 0

## 2015-12-05 ENCOUNTER — Inpatient Hospital Stay (HOSPITAL_COMMUNITY): Payer: BC Managed Care – PPO | Admitting: Anesthesiology

## 2015-12-05 ENCOUNTER — Emergency Department (HOSPITAL_COMMUNITY)
Admission: EM | Admit: 2015-12-05 | Discharge: 2015-12-06 | Disposition: A | Discharge status: Home / Self Care | Payer: BC Managed Care – PPO | Attending: Emergency Medicine | Admitting: Emergency Medicine

## 2015-12-05 ENCOUNTER — Ambulatory Visit (HOSPITAL_COMMUNITY): Payer: PRIVATE HEALTH INSURANCE

## 2015-12-05 ENCOUNTER — Inpatient Hospital Stay (HOSPITAL_COMMUNITY)
Admission: AD | Admit: 2015-12-05 | Discharge: 2015-12-05 | Disposition: A | Discharge status: Home / Self Care | Payer: BC Managed Care – PPO | Source: Ambulatory Visit | Attending: Anesthesiology | Admitting: Anesthesiology

## 2015-12-05 DIAGNOSIS — H53149 Visual discomfort, unspecified: Secondary | ICD-10-CM | POA: Insufficient documentation

## 2015-12-05 DIAGNOSIS — R51 Headache: Secondary | ICD-10-CM | POA: Insufficient documentation

## 2015-12-05 DIAGNOSIS — H538 Other visual disturbances: Secondary | ICD-10-CM | POA: Diagnosis not present

## 2015-12-05 DIAGNOSIS — Z79899 Other long term (current) drug therapy: Secondary | ICD-10-CM | POA: Insufficient documentation

## 2015-12-05 DIAGNOSIS — Z87891 Personal history of nicotine dependence: Secondary | ICD-10-CM | POA: Diagnosis not present

## 2015-12-05 DIAGNOSIS — R519 Headache, unspecified: Secondary | ICD-10-CM

## 2015-12-05 DIAGNOSIS — Z791 Long term (current) use of non-steroidal anti-inflammatories (NSAID): Secondary | ICD-10-CM | POA: Insufficient documentation

## 2015-12-05 MED ORDER — SODIUM CHLORIDE 0.9 % IV SOLN
1000.0000 mL | Freq: Once | INTRAVENOUS | Status: AC
  Administered 2015-12-06: 1000 mL via INTRAVENOUS

## 2015-12-05 MED ORDER — METOCLOPRAMIDE HCL 5 MG/ML IJ SOLN
10.0000 mg | Freq: Once | INTRAMUSCULAR | Status: AC
  Administered 2015-12-06: 10 mg via INTRAVENOUS
  Filled 2015-12-05: qty 2

## 2015-12-05 MED ORDER — DIPHENHYDRAMINE HCL 50 MG/ML IJ SOLN
25.0000 mg | Freq: Once | INTRAMUSCULAR | Status: AC
  Administered 2015-12-06: 25 mg via INTRAVENOUS
  Filled 2015-12-05: qty 1

## 2015-12-05 MED ORDER — SODIUM CHLORIDE 0.9 % IV SOLN: 1000.0000 mL | INTRAVENOUS | Status: DC

## 2015-12-05 NOTE — Consult Note (Addendum)
Evaluation for Persistent HA  [name] is a 40 y/o healthy female with history of dural puncture and subsequent blood on 10/26/2015. She presents today with sudden onset of increased head pain in the L frontal region after bending over at 1430 this afternoon. She states the pain was initially 10/10 and stabbing in nature. She professes associated dizziness and blurry vision but was able to drive home and to her husband's job. She states over the past 3 hours her pain has subsided but she does endorse increased photophobia. She denies nausea, aura and extremity weakness.  Per her husband, her headache improved slightly after the blood patch but never totally resolved.   Physical Exam: Gen: AF, uncomfortable, oriented, mentating CV: RRR Pulm: CTAB, no w/r/r Abd: soft, nt, nd Neuro: No neuro deficits noted. Strength 5/5 bilaterally UE & LE. Pt able to ambulate.   Assessment & Plan: [name] is a 40 y/o healthy female with history of headache s/p epidural and subsequent blood patch on 09/2015. At this time, I believe repeating the epidural blood patch will provide very little benefit compared to the associated risk. In most cases, PDPH's resolve spontaneously within 14 days with conservative therapy. Despite the epidural blood patch approximately 40 days ago, Stacey Proctor states her headache never totally resolved and suddenly worsened today. My current recommendation after speaking with [name] and her husband is to visit the closest emergency department [facility]([place] [facility] for further evaluation by a neurologist. Due to the current intensity of her pain and photophobia, I do not recommend going home and awaiting an appointment in a neurology clinic. I spoke with Dr. Billy Proctor and he is in agreement.   Stacey LayerKevin D. Hart RochesterHollis, MD, [facility] Anesthesiology   [facility] per report from the receptionist. I notified them I cannot order a Head CT at [facility]. I recommended Stacey Proctor be evaluated by ED physician and neurologist if deemed necessary by the ED physician. I attempted to speak with Stacey Proctor in person but he left prior to me reaching check-in desk. I left voice message on Stacey Proctor phone reiterating my recommendations.

## 2015-12-05 NOTE — Consult Note (Signed)
Evaluation for Persistent HA  [name] is a 40 y/o healthy female with history of dural puncture and subsequent blood on 10/26/2015. She presents today with sudden onset of increased head pain in the L frontal region after bending over at 1430 this afternoon. She states the pain was initially 10/10 and stabbing in nature. She professes associated dizziness and blurry vision but was able to drive home and to her husband's job. She states over the past 3 hours her pain has subsided but she does endorse increased photophobia. She denies nausea, aura and extremity weakness.  Per her husband, her headache improved slightly after the blood patch but never totally resolved.    Physical Exam: Gen: AF, uncomfortable, oriented, mentating CV: RRR Pulm: CTAB, no w/r/r Abd: soft, nt, nd Neuro: No neuro deficits noted. Strength 5/5 bilaterally UE & LE. Pt able to ambulate.    Assessment & Plan: [name] is a 40 y/o healthy female with history of headache s/p epidural and subsequent blood patch on 09/2015. At this time, I believe repeating the epidural blood patch will provide very little benefit compared to the associated risk. In most cases, PDPH's resolve spontaneously within 14 days with conservative therapy. Despite the epidural blood patch approximately 40 days ago, Ms. Griffitts states her headache never totally resolved and suddenly worsened today. My current recommendation after speaking with [name] and her husband is to visit the closest emergency department [facility]([place] [facility] for further evaluation by a neurologist. Due to the current intensity of her pain and photophobia, I do not recommend going home and awaiting an appointment in a neurology clinic. I spoke with Dr. Billy Coastaavon and he is in agreement.    Kaylyn LayerKevin D. Hart RochesterHollis, MD, [facility] Anesthesiology

## 2015-12-05 NOTE — Anesthesia Preprocedure Evaluation (Deleted)
Anesthesia Evaluation  Patient identified by MRN, date of birth, ID band Patient awake    Airway Mallampati: II       Dental  (+) Teeth Intact   Pulmonary    breath sounds clear to auscultation       Cardiovascular  Rhythm:Regular Rate:Normal     Neuro/Psych  Headaches, negative psych ROS   GI/Hepatic   Endo/Other  diabetes  Renal/GU   negative genitourinary   Musculoskeletal   Abdominal   Peds negative pediatric ROS (+)  Hematology   Anesthesia Other Findings   Reproductive/Obstetrics                             Anesthesia Physical Anesthesia Plan  ASA: II  Anesthesia Plan:    Post-op Pain Management:    Induction:   Airway Management Planned:   Additional Equipment:   Intra-op Plan:   Post-operative Plan:   Informed Consent:   Plan Discussed with:   Anesthesia Plan Comments:         Anesthesia Quick Evaluation

## 2015-12-05 NOTE — ED Provider Notes (Signed)
CSN: [id number]     Arrival date & time 12/05/15  2127 History  By signing my name below, I, Stacey Proctor, attest that this documentation has been prepared under the direction and in the presence of Stacey Booze, MD. Electronically Signed: Linus Proctor, ED Scribe. 12/06/2015. 11:35 PM.   Chief Complaint  Patient presents with  . Headache   The history is provided by the patient. No language interpreter was used.   HPI Comments: Stacey Proctor is a 40 y.o. female with a PMHx of epidural anesthesia who presents to the Emergency Department complaining of a stabbing headache that began 9 hours ago. She rates the pain 7-8/10. Pt also reports blurry vision and  photophobia. Pt states 6 weeks ago, she had epidural anesthesia procedure performed during her  puerperium that did not take so she was placed on a blood patch. However, today she began having a HA so she was seen by an anesthesiologist at [facility]. There, the doctor did not replace blood patch and referred pt to neurology. Pt came to the ED for persistent pain. Pt denies any other symptoms at this time.   Pt breastfeeds.   Past Medical History  Diagnosis Date  . Medical history non-contributory   . Gestational diabetes mellitus, antepartum   . Herpes   . Abnormal Pap smear   . Postpartum care following vaginal delivery (1/12) 10/10/2013  . Vaginal Pap smear, abnormal   . Gestational diabetes   . Maternal iron deficiency anemia 10/26/2015  . Acute blood loss anemia 10/26/2015  . Epidural anesthesia-induced headache during puerperium 10/26/2015   Past Surgical History  Procedure Laterality Date  . Tubal ligation    . Tubal reversal  09/24/2012  . Wisdom tooth extraction    . Leep     Family History  Problem Relation Age of Onset  . Transient ischemic attack Mother   . Crohn's disease Mother   . Rheum arthritis Mother   . Seizures Mother   . Diabetes Father   . Heart disease Father   . Hypertension Father   . Heart attack  Father   . Rheum arthritis Father   . Heart disease Maternal Grandmother   . Diabetes Maternal Grandfather   . Diabetes Paternal Grandmother   . Cancer Paternal Grandfather   . Heart attack Paternal Grandfather   . Hypertension Paternal Grandfather   . Emphysema Paternal Grandfather   . Birth defects Son     polydactyl   Social History  Substance Use Topics  . Smoking status: Never Smoker   . Smokeless tobacco: Former Neurosurgeon    Quit date: 09/23/2012  . Alcohol Use: Yes     Comment: very occ.   OB History    Gravida Para Term Preterm AB TAB SAB Ectopic Multiple Living   5 4 4  0 1 0 1 0 0 4     Review of Systems  Constitutional: Negative for chills.  HENT: Negative for rhinorrhea.   Eyes: Positive for photophobia and visual disturbance. Negative for redness.  Respiratory: Negative for shortness of breath and wheezing.   Cardiovascular: Negative for chest pain and palpitations.  Genitourinary: Negative for dysuria and urgency.  Musculoskeletal: Negative for arthralgias.  Skin: Negative for pallor and wound.  Neurological: Positive for headaches.  All other systems reviewed and are negative.  Allergies  Codeine; Penicillins; Sulfa antibiotics; and Lyrica  Home Medications   Prior to Admission medications   Medication Sig Start Date End Date Taking? Authorizing Provider  cyclobenzaprine (  FLEXERIL) 10 MG tablet Take 1 tablet (10 mg total) by mouth 3 (three) times daily as needed for muscle spasms. 10/29/15  Yes Raelyn Mora, CNM  ibuprofen (ADVIL,MOTRIN) 200 MG tablet Take 600 mg by mouth every 6 (six) hours as needed for mild pain.   Yes Historical Provider, MD  iron polysaccharides (NIFEREX) 150 MG capsule Take 1 capsule (150 mg total) by mouth 2 (two) times daily. 10/29/15  Yes Raelyn Mora, CNM  magnesium oxide (MAG-OX) 400 (241.3 Mg) MG tablet Take 1 tablet (400 mg total) by mouth daily. 10/29/15  Yes Raelyn Mora, CNM  Prenatal Vit-Fe Fumarate-FA (PRENATAL  MULTIVITAMIN) TABS Take 1 tablet by mouth daily.    Yes Historical Provider, MD  valACYclovir (VALTREX) 500 MG tablet Take 500 mg by mouth daily.    Yes Historical Provider, MD  vitamin C (VITAMIN C) 500 MG tablet Take 1 tablet (500 mg total) by mouth 2 (two) times daily. 10/29/15  Yes Raelyn Mora, CNM  butalbital-acetaminophen-caffeine (FIORICET, ESGIC) 50-325-40 MG tablet Take 2 tablets by mouth every 4 (four) hours as needed for headache (spinal headache). Patient not taking: Reported on 12/05/2015 10/29/15   Raelyn Mora, CNM  HYDROcodone-acetaminophen (NORCO/VICODIN) 5-325 MG tablet Take 1-2 tablets by mouth every 4 (four) hours as needed for moderate pain or severe pain. Patient not taking: Reported on 12/05/2015 10/29/15   Raelyn Mora, CNM  ibuprofen (ADVIL,MOTRIN) 600 MG tablet Take 1 tablet (600 mg total) by mouth every 6 (six) hours. Patient not taking: Reported on 12/05/2015 10/29/15   Raelyn Mora, CNM  magic mouthwash SOLN Take 5 mLs by mouth 3 (three) times daily. Patient not taking: Reported on 12/05/2015 10/29/15   Raelyn Mora, CNM   BP 133/94 mmHg  Pulse 68  Temp(Src) 98.3 F (36.8 C) (Oral)  Resp 20  Ht  (1.549 m)  Wt 160 lb (72.576 kg)  BMI 30.25 kg/m2  SpO2 100%   Physical Exam  Constitutional: She is oriented to person, place, and time. She appears well-developed and well-nourished.  Appears uncomfortable with marked photophobia  HENT:  Head: Normocephalic and atraumatic.  Eyes: EOM are normal. Pupils are equal, round, and reactive to light.  Will not cooperate for fundoscopic exam  Neck: Normal range of motion. Neck supple. No JVD present.  Cardiovascular: Normal rate, regular rhythm and normal heart sounds.   No murmur heard. Pulmonary/Chest: Effort normal and breath sounds normal. She has no wheezes. She has no rales. She exhibits no tenderness.  Abdominal: Soft. Bowel sounds are normal. She exhibits no distension and no mass. There is no tenderness.   Musculoskeletal: Normal range of motion. She exhibits no edema.  Lymphadenopathy:    She has no cervical adenopathy.  Neurological: She is alert and oriented to person, place, and time. No cranial nerve deficit. She exhibits normal muscle tone. Coordination normal.  No focal, motor, or sensory deficits.   Skin: Skin is warm and dry. No rash noted.  Psychiatric: She has a normal mood and affect. Her behavior is normal. Judgment and thought content normal.  Nursing note and vitals reviewed.  ED Course  Procedures   DIAGNOSTIC STUDIES: Oxygen Saturation is 100% on room air, normal by my interpretation.    COORDINATION OF CARE: 11:30 PM Will order CT head. Will give fluids, reglan and benadryl. Discussed treatment plan with pt at bedside and pt agreed to plan.  Imaging Review Ct Angio Head W/cm &/or Wo Cm  12/06/2015  CLINICAL DATA:  40 year old female with severe  headache for 9 hours. Blurred vision and photophobia. Initial encounter. Status post epidural anesthesia 6 weeks ago with subsequent blood patch. EXAM: CT ANGIOGRAPHY HEAD TECHNIQUE: Multidetector CT imaging of the head was performed using the standard protocol during bolus administration of intravenous contrast. Multiplanar CT image reconstructions and MIPs were obtained to evaluate the vascular anatomy. CONTRAST:  OMNIPAQUE IOHEXOL 350 MG/ML SOLN COMPARISON:  Noncontrast head CT 0015 hours today. FINDINGS: Posterior circulation: Distal vertebral arteries are patent and fairly codominant. Normal PICA origins and vertebrobasilar junction. No distal vertebral artery irregularity or stenosis. Basilar artery is patent without stenosis. AICA and SCA origins are normal. Fetal type right PCA origin. Normal left PCA origin and left posterior communicating artery. Bilateral PCA branches are within normal limits. Anterior circulation: Negative distal cervical ICAs. Both ICA siphons are patent and within normal limits. Normal posterior  communicating artery origins. The ophthalmic artery origins are diminutive and difficult to visualize. Normal carotid termini. Normal MCA and ACA origins. Diminutive or absent anterior communicating artery. Bilateral ACA branches are within normal limits. She left MCA M1 segment, bifurcation, and left MCA branches are within normal limits. Right MCA M1 segment, bifurcation, and right MCA branches are within normal limits. Venous sinuses: Patent. Anatomic variants: Fetal type right PCA origin. Delayed phase:No abnormal enhancement identified. IMPRESSION: Negative intracranial CTA. Normal postcontrast CT appearance of the brain. Electronically Signed   By: Odessa Fleming M.D.   On: 12/06/2015 06:47   Ct Head Wo Contrast  12/06/2015  CLINICAL DATA:  Headache beginning 9 hours ago. Blurred vision and photophobia. Epidural anesthesia 6 weeks ago. EXAM: CT HEAD WITHOUT CONTRAST TECHNIQUE: Contiguous axial images were obtained from the base of the skull through the vertex without intravenous contrast. COMPARISON:  None. FINDINGS: Ventricles and sulci are symmetrical. No ventricular dilatation. No mass effect or midline shift. No abnormal extra-axial fluid collections. Gray-white matter junctions are distinct. Basal cisterns are not effaced. No evidence of acute intracranial hemorrhage. No depressed skull fractures. Visualized paranasal sinuses and mastoid air cells are not opacified. IMPRESSION: No acute intracranial abnormalities. Electronically Signed   By: Burman Nieves M.D.   On: 12/06/2015 00:43   I have personally reviewed and evaluated these images and lab results as part of my medical decision-making.   MDM   Final diagnoses:  Headache, unspecified headache type    Headache that has many components suggestive of migraine, but worrisome thunderclap onset. Old records reviewed and she had a post spinal headache following epidural for delivery several months ago. She did have an epidural blood patch which she  states did not help. She is sent for CT of the head and is given a migraine cocktail of metoclopramide, diphenhydramine, and dexamethasone with little relief of her headache. She was given additional fluids and prochlorperazine with modest relief of headache. She was given ketorolac with additional modest relief. At this point, I was worried that she had to be ruled out for subarachnoid hemorrhage and recommended lumbar puncture to patient. She was very reluctant to have lumbar puncture done because of her problems with post spinal headache from the epidural. I was not able to convince her to have a lumbar puncture done. As an alternate way for evaluating for cerebral aneurysm, she was sent for CT angiogram of the head which showed no evidence of aneurysms. She is still having some headache and was given a dose of morphine. She is discharged with prescriptions for prochlorperazine and hydrocodone-acetaminophen. Follow-up with PCP.  I personally performed  the services described in this documentation, which was scribed in my presence. The recorded information has been reviewed and is accurate.      Stacey Boozeavid Vegas Fritze, MD 12/06/15 [phone]

## 2015-12-05 NOTE — ED Notes (Signed)
Pt c/o headache after bending over, pt had epidural that did not take and they did a blood patch. [facility] and was told to [facility] or [place] for ct and neuro consult.

## 2015-12-06 ENCOUNTER — Emergency Department (HOSPITAL_COMMUNITY): Payer: BC Managed Care – PPO

## 2015-12-06 MED ORDER — PROCHLORPERAZINE EDISYLATE 5 MG/ML IJ SOLN
10.0000 mg | Freq: Once | INTRAMUSCULAR | Status: AC
  Administered 2015-12-06: 10 mg via INTRAVENOUS
  Filled 2015-12-06: qty 2

## 2015-12-06 MED ORDER — IOHEXOL 350 MG/ML SOLN
100.0000 mL | Freq: Once | INTRAVENOUS | Status: AC | PRN
  Administered 2015-12-06: 100 mL via INTRAVENOUS

## 2015-12-06 MED ORDER — KETOROLAC TROMETHAMINE 30 MG/ML IJ SOLN
30.0000 mg | Freq: Once | INTRAMUSCULAR | Status: AC
  Administered 2015-12-06: 30 mg via INTRAVENOUS
  Filled 2015-12-06: qty 1

## 2015-12-06 MED ORDER — HYDROCODONE-ACETAMINOPHEN 5-325 MG PO TABS: 1.0000 | ORAL_TABLET | ORAL | Status: DC | PRN

## 2015-12-06 MED ORDER — SODIUM CHLORIDE 0.9 % IV SOLN
1000.0000 mL | INTRAVENOUS | Status: DC
Start: 2015-12-06 — End: 2015-12-06

## 2015-12-06 MED ORDER — PROCHLORPERAZINE MALEATE 10 MG PO TABS: 10.0000 mg | ORAL_TABLET | Freq: Four times a day (QID) | ORAL | Status: DC | PRN

## 2015-12-06 MED ORDER — SODIUM CHLORIDE 0.9 % IV SOLN
1000.0000 mL | Freq: Once | INTRAVENOUS | Status: AC
  Administered 2015-12-06: 1000 mL via INTRAVENOUS

## 2015-12-06 MED ORDER — MORPHINE SULFATE (PF) 4 MG/ML IV SOLN
4.0000 mg | Freq: Once | INTRAVENOUS | Status: AC
  Administered 2015-12-06: 4 mg via INTRAVENOUS
  Filled 2015-12-06: qty 1

## 2015-12-06 NOTE — ED Notes (Signed)
Patient is awaiting arrival of husband to transport her home.

## 2015-12-06 NOTE — Discharge Instructions (Signed)
General Headache Without Cause A headache is pain or discomfort felt around the head or neck area. The specific cause of a headache may not be found. There are many causes and types of headaches. A few common ones are:  Tension headaches.  Migraine headaches.  Cluster headaches.  Chronic daily headaches. HOME CARE INSTRUCTIONS  Watch your condition for any changes. Take these steps to help with your condition: Managing Pain  Take over-the-counter and prescription medicines only as told by your health care provider.  Lie down in a dark, quiet room when you have a headache.  If directed, apply ice to the head and neck area:  Put ice in a plastic bag.  Place a towel between your skin and the bag.  Leave the ice on for 20 minutes, 2-3 times per day.  Use a heating pad or hot shower to apply heat to the head and neck area as told by your health care provider.  Keep lights dim if bright lights bother you or make your headaches worse. Eating and Drinking  Eat meals on a regular schedule.  Limit alcohol use.  Decrease the amount of caffeine you drink, or stop drinking caffeine. General Instructions  Keep all follow-up visits as told by your health care provider. This is important.  Keep a headache journal to help find out what may trigger your headaches. For example, write down:  What you eat and drink.  How much sleep you get.  Any change to your diet or medicines.  Try massage or other relaxation techniques.  Limit stress.  Sit up straight, and do not tense your muscles.  Do not use tobacco products, including cigarettes, chewing tobacco, or e-cigarettes. If you need help quitting, ask your health care provider.  Exercise regularly as told by your health care provider.  Sleep on a regular schedule. Get 7-9 hours of sleep, or the amount recommended by your health care provider. SEEK MEDICAL CARE IF:   Your symptoms are not helped by medicine.  You have a  headache that is different from the usual headache.  You have nausea or you vomit.  You have a fever. SEEK IMMEDIATE MEDICAL CARE IF:   Your headache becomes severe.  You have repeated vomiting.  You have a stiff neck.  You have a loss of vision.  You have problems with speech.  You have pain in the eye or ear.  You have muscular weakness or loss of muscle control.  You lose your balance or have trouble walking.  You feel faint or pass out.  You have confusion.   This information is not intended to replace advice given to you by your health care provider. Make sure you discuss any questions you have with your health care provider.   Document Released: 09/15/2005 Document Revised: 06/06/2015 Document Reviewed: 01/08/2015 Elsevier Interactive Patient Education 2016 [employer].  Prochlorperazine tablets What is this medicine? PROCHLORPERAZINE (proe klor PER a zeen) helps to control severe nausea and vomiting. This medicine is also used to treat schizophrenia. It can also help patients who experience anxiety that is not due to psychological illness. This medicine may be used for other purposes; ask your health care provider or pharmacist if you have questions. What should I tell my health care provider before I take this medicine? They need to know if you have any of these conditions: -blood disorders or disease -dementia -liver disease or jaundice -Parkinson's disease -uncontrollable movement disorder -an unusual or allergic reaction to prochlorperazine, other  medicines, foods, dyes, or preservatives -pregnant or trying to get pregnant -breast-feeding How should I use this medicine? Take this medicine by mouth with a glass of water. Follow the directions on the prescription label. Take your doses at regular intervals. Do not take your medicine more often than directed. Do not stop taking this medicine suddenly. This can cause nausea, vomiting, and dizziness. Ask your  doctor or health care professional for advice. Talk to your pediatrician regarding the use of this medicine in children. Special care may be needed. While this drug may be prescribed for children as young as 2 years for selected conditions, precautions do apply. Overdosage: If you think you have taken too much of this medicine contact a poison control center or emergency room at once. NOTE: This medicine is only for you. Do not share this medicine with others. What if I miss a dose? If you miss a dose, take it as soon as you can. If it is almost time for your next dose, take only that dose. Do not take double or extra doses. What may interact with this medicine? Do not take this medicine with any of the following medications: -amoxapine -antidepressants like citalopram, escitalopram, fluoxetine, paroxetine, and sertraline -deferoxamine -dofetilide -maprotiline -tricyclic antidepressants like amitriptyline, clomipramine, imipramine, nortiptyline and others This medicine may also interact with the following medications: -lithium -medicines for pain -phenytoin -propranolol -warfarin This list may not describe all possible interactions. Give your health care provider a list of all the medicines, herbs, non-prescription drugs, or dietary supplements you use. Also tell them if you smoke, drink alcohol, or use illegal drugs. Some items may interact with your medicine. What should I watch for while using this medicine? Visit your doctor or health care professional for regular checks on your progress. You may get drowsy or dizzy. Do not drive, use machinery, or do anything that needs mental alertness until you know how this medicine affects you. Do not stand or sit up quickly, especially if you are an older patient. This reduces the risk of dizzy or fainting spells. Alcohol may interfere with the effect of this medicine. Avoid alcoholic drinks. This medicine can reduce the response of your body to  heat or cold. Dress warm in cold weather and stay hydrated in hot weather. If possible, avoid extreme temperatures like saunas, hot tubs, very hot or cold showers, or activities that can cause dehydration such as vigorous exercise. This medicine can make you more sensitive to the sun. Keep out of the sun. If you cannot avoid being in the sun, wear protective clothing and use sunscreen. Do not use sun lamps or tanning beds/booths. Your mouth may get dry. Chewing sugarless gum or sucking hard candy, and drinking plenty of water may help. Contact your doctor if the problem does not go away or is severe. What side effects may I notice from receiving this medicine? Side effects that you should report to your doctor or health care professional as soon as possible: -blurred vision -breast enlargement in men or women -breast milk in women who are not breast-feeding -chest pain, fast or irregular heartbeat -confusion, restlessness -dark yellow or brown urine -difficulty breathing or swallowing -dizziness or fainting spells -drooling, shaking, movement difficulty (shuffling walk) or rigidity -fever, chills, sore throat -involuntary or uncontrollable movements of the eyes, mouth, head, arms, and legs -seizures -stomach area pain -unusually weak or tired -unusual bleeding or bruising -yellowing of skin or eyes Side effects that usually do not require medical attention (report  to your doctor or health care professional if they continue or are bothersome): -difficulty passing urine -difficulty sleeping -headache -sexual dysfunction -skin rash, or itching This list may not describe all possible side effects. Call your doctor for medical advice about side effects. You may report side effects to FDA at 1-800-FDA-1088. Where should I keep my medicine? Keep out of the reach of children. Store at room temperature between 15 and 30 degrees C (59 and 86 degrees F). Protect from light. Throw away any unused  medicine after the expiration date. NOTE: This sheet is a summary. It may not cover all possible information. If you have questions about this medicine, talk to your doctor, pharmacist, or health care provider.    2016, Elsevier/Gold Standard. (2012-02-03 16:59:39)  Acetaminophen; Hydrocodone tablets or capsules What is this medicine? ACETAMINOPHEN; HYDROCODONE (a set a MEE noe fen; hye droe KOE done) is a pain reliever. It is used to treat moderate to severe pain. This medicine may be used for other purposes; ask your health care provider or pharmacist if you have questions. What should I tell my health care provider before I take this medicine? They need to know if you have any of these conditions: -brain tumor -Crohn's disease, inflammatory bowel disease, or ulcerative colitis -drug abuse or addiction -head injury -heart or circulation problems -if you often drink alcohol -kidney disease or problems going to the bathroom -liver disease -lung disease, asthma, or breathing problems -an unusual or allergic reaction to acetaminophen, hydrocodone, other opioid analgesics, other medicines, foods, dyes, or preservatives -pregnant or trying to get pregnant -breast-feeding How should I use this medicine? Take this medicine by mouth. Swallow it with a full glass of water. Follow the directions on the prescription label. If the medicine upsets your stomach, take the medicine with food or milk. Do not take more than you are told to take. Talk to your pediatrician regarding the use of this medicine in children. This medicine is not approved for use in children. Patients over 65 years may have a stronger reaction and need a smaller dose. Overdosage: If you think you have taken too much of this medicine contact a poison control center or emergency room at once. NOTE: This medicine is only for you. Do not share this medicine with others. What if I miss a dose? If you miss a dose, take it as soon as  you can. If it is almost time for your next dose, take only that dose. Do not take double or extra doses. What may interact with this medicine? -alcohol -antihistamines -isoniazid -medicines for depression, anxiety, or psychotic disturbances -medicines for sleep -muscle relaxants -naltrexone -narcotic medicines (opiates) for pain -phenobarbital -ritonavir -tramadol This list may not describe all possible interactions. Give your health care provider a list of all the medicines, herbs, non-prescription drugs, or dietary supplements you use. Also tell them if you smoke, drink alcohol, or use illegal drugs. Some items may interact with your medicine. What should I watch for while using this medicine? Tell your doctor or health care professional if your pain does not go away, if it gets worse, or if you have new or a different type of pain. You may develop tolerance to the medicine. Tolerance means that you will need a higher dose of the medicine for pain relief. Tolerance is normal and is expected if you take the medicine for a long time. Do not suddenly stop taking your medicine because you may develop a severe reaction. Your body becomes  used to the medicine. This does NOT mean you are addicted. Addiction is a behavior related to getting and using a drug for a non-medical reason. If you have pain, you have a medical reason to take pain medicine. Your doctor will tell you how much medicine to take. If your doctor wants you to stop the medicine, the dose will be slowly lowered over time to avoid any side effects. You may get drowsy or dizzy when you first start taking the medicine or change doses. Do not drive, use machinery, or do anything that may be dangerous until you know how the medicine affects you. Stand or sit up slowly. There are different types of narcotic medicines (opiates) for pain. If you take more than one type at the same time, you may have more side effects. Give your health care  provider a list of all medicines you use. Your doctor will tell you how much medicine to take. Do not take more medicine than directed. Call emergency for help if you have problems breathing. The medicine will cause constipation. Try to have a bowel movement at least every 2 to 3 days. If you do not have a bowel movement for 3 days, call your doctor or health care professional. Too much acetaminophen can be very dangerous. Do not take Tylenol (acetaminophen) or medicines that contain acetaminophen with this medicine. Many non-prescription medicines contain acetaminophen. Always read the labels carefully. What side effects may I notice from receiving this medicine? Side effects that you should report to your doctor or health care professional as soon as possible: -allergic reactions like skin rash, itching or hives, swelling of the face, lips, or tongue -breathing problems -confusion -feeling faint or lightheaded, falls -stomach pain -yellowing of the eyes or skin Side effects that usually do not require medical attention (report to your doctor or health care professional if they continue or are bothersome): -nausea, vomiting -stomach upset This list may not describe all possible side effects. Call your doctor for medical advice about side effects. You may report side effects to FDA at 1-800-FDA-1088. Where should I keep my medicine? Keep out of the reach of children. This medicine can be abused. Keep your medicine in a safe place to protect it from theft. Do not share this medicine with anyone. Selling or giving away this medicine is dangerous and against the law. This medicine may cause accidental overdose and death if it taken by other adults, children, or pets. Mix any unused medicine with a substance like cat litter or coffee grounds. Then throw the medicine away in a sealed container like a sealed bag or a coffee can with a lid. Do not use the medicine after the expiration date. Store at room  temperature between 15 and 30 degrees C (59 and 86 degrees F). NOTE: This sheet is a summary. It may not cover all possible information. If you have questions about this medicine, talk to your doctor, pharmacist, or health care provider.    2016, Elsevier/Gold Standard. (2014-08-16 15:29:20)

## 2015-12-06 NOTE — ED Notes (Signed)
Pt still awaiting husband to transport her home.

## 2015-12-08 ENCOUNTER — Ambulatory Visit (HOSPITAL_COMMUNITY): Payer: BC Managed Care – PPO | Admitting: Anesthesiology

## 2015-12-08 ENCOUNTER — Encounter (HOSPITAL_COMMUNITY): Payer: Self-pay

## 2015-12-08 ENCOUNTER — Ambulatory Visit (HOSPITAL_COMMUNITY): Payer: BC Managed Care – PPO

## 2015-12-08 ENCOUNTER — Ambulatory Visit (HOSPITAL_COMMUNITY)
Admission: AD | Admit: 2015-12-08 | Discharge: 2015-12-08 | Disposition: A | Discharge status: Home / Self Care | Payer: BC Managed Care – PPO | Source: Ambulatory Visit | Attending: Anesthesiology | Admitting: Anesthesiology

## 2015-12-08 DIAGNOSIS — O894 Spinal and epidural anesthesia-induced headache during the puerperium: Secondary | ICD-10-CM | POA: Diagnosis not present

## 2015-12-08 MED ORDER — MIDAZOLAM HCL 2 MG/2ML IJ SOLN
INTRAMUSCULAR | Status: AC
  Filled 2015-12-08: qty 2

## 2015-12-08 MED ORDER — FENTANYL CITRATE (PF) 100 MCG/2ML IJ SOLN
100.0000 ug | Freq: Once | INTRAMUSCULAR | Status: AC | PRN
  Administered 2015-12-08: 100 ug via INTRAVENOUS

## 2015-12-08 MED ORDER — FENTANYL CITRATE (PF) 100 MCG/2ML IJ SOLN
INTRAMUSCULAR | Status: AC
  Filled 2015-12-08: qty 2

## 2015-12-08 MED ORDER — MIDAZOLAM HCL 2 MG/2ML IJ SOLN
2.0000 mg | Freq: Once | INTRAMUSCULAR | Status: AC | PRN
  Administered 2015-12-08: 2 mg via INTRAVENOUS

## 2015-12-08 NOTE — Anesthesia Procedure Notes (Signed)
Epidural  Start time: 12/08/2015 11:13 PM End time: 12/08/2015 11:20 PM  Staffing Anesthesiologist: Mal AmabileFOSTER, Jordie Skalsky Performed by: anesthesiologist   Preanesthetic Checklist Completed: patient identified, site marked, surgical consent, pre-op evaluation, timeout performed, IV checked, risks and benefits discussed and monitors and equipment checked  Epidural Patient position: sitting Prep: DuraPrep Patient monitoring: blood pressure, continuous pulse ox, cardiac monitor and heart rate Approach: midline Location: L5-S1 Injection technique: LOR air  Needle:  Needle type: Tuohy  Needle gauge: 17 G Needle length: 9 cm Needle insertion depth: 5 cm  Additional Notes Epidural performed at site of previous wet tap. No CSF, no heme and no paresthesias. 20ml of whole blood drawn from Right AC vein which overlying skin prepped prior to performance of procedure. Blood then injected into the epidural space and epidural needle withdrawn. Patient tolerated procedure well. She reported some discomfort in her left foot and toes during injection of the blood, which was transient. Reason for block:Epidural Blood Patch

## 2015-12-08 NOTE — Progress Notes (Signed)
Patient brought to PACU for blood patch at 2250. LR 1000cc initiated per request Dr Malen GauzeFoster. Unable to get order in EPIC.  Difficulty getting patient PACU out-patient procedure flow sheet to populate. Patients barcode will not scan. All information entered regarding procedure. Patient tolerated procedure well. Reclined on stretcher in lawn chair position. Feeling well. See flowsheets.

## 2015-12-08 NOTE — Progress Notes (Signed)
Time out performed at 2310 prior to procedure. Time created when charting afterwards.

## 2015-12-08 NOTE — Anesthesia Preprocedure Evaluation (Addendum)
Anesthesia Evaluation  Patient identified by MRN, date of birth, ID band Patient awake    Reviewed: Allergy & Precautions, NPO status , Patient's Chart, lab work & pertinent test results  History of Anesthesia Complications (+) history of anesthetic complications  Airway Mallampati: II  TM Distance: >3 FB Neck ROM: Full    Dental no notable dental hx. (+) Teeth Intact   Pulmonary neg pulmonary ROS,    Pulmonary exam normal breath sounds clear to auscultation       Cardiovascular negative cardio ROS Normal cardiovascular exam Rhythm:Regular Rate:Normal     Neuro/Psych  Headaches, Post dural puncture HA- positional HA since delivery. Had LEBP within 24 hours of known wet tap. Helped but she did not get full resolution. Bent over 4 days ago and felt something "pop" in her back and developed severe positional HA. She has severe photophobia. negative psych ROS   GI/Hepatic negative GI ROS, Neg liver ROS,   Endo/Other  diabetes, Well Controlled, Gestational  Renal/GU negative Renal ROS  negative genitourinary   Musculoskeletal negative musculoskeletal ROS (+)   Abdominal (+) + obese,   Peds  Hematology  (+) anemia ,   Anesthesia Other Findings   Reproductive/Obstetrics                            Anesthesia Physical Anesthesia Plan  ASA: II  Anesthesia Plan: Epidural   Post-op Pain Management:    Induction: Intravenous  Airway Management Planned: Natural Airway  Additional Equipment:   Intra-op Plan:   Post-operative Plan:   Informed Consent: I have reviewed the patients History and Physical, chart, labs and discussed the procedure including the risks, benefits and alternatives for the proposed anesthesia with the patient or authorized representative who has indicated his/her understanding and acceptance.     Plan Discussed with: Anesthesiologist  Anesthesia Plan Comments:          Anesthesia Quick Evaluation

## 2015-12-08 NOTE — Transfer of Care (Addendum)
Immediate Anesthesia Transfer of Care Note  Patient: Stacey Proctor  Procedure(s) Performed: Lumbar Epidural Blood Patch  Patient Location: PACU  Anesthesia Type:Epidural  Level of Consciousness: awake, alert  and oriented  Airway & Oxygen Therapy: Patient Spontanous Breathing  Post-op Assessment: Report given to RN, Post -op Vital signs reviewed and stable, Post -op Vital signs reviewed and unstable, Anesthesiologist notified, Patient moving all extremities and Patient moving all extremities X 4  Post vital signs: Reviewed and stable  Last Vitals:  Filed Vitals:   12/08/15 2323 12/08/15 2334  BP: 134/91 131/89  Pulse: 75   Temp:    Resp: 16 16    Complications: No apparent anesthesia complications

## 2015-12-09 NOTE — Anesthesia Postprocedure Evaluation (Signed)
Anesthesia Post Note  Patient: Celso SickleJudy Lowenstein  Procedure(s) Performed: * No procedures listed *  Patient location during evaluation: PACU Anesthesia Type: Epidural Level of consciousness: awake and alert and oriented Pain management: pain level controlled Vital Signs Assessment: post-procedure vital signs reviewed and stable Respiratory status: spontaneous breathing, nonlabored ventilation and respiratory function stable Cardiovascular status: blood pressure returned to baseline and stable Postop Assessment: no headache and no backache Anesthetic complications: no    Last Vitals: There were no vitals filed for this visit.  Last Pain: There were no vitals filed for this visit.               Vonzella Althaus A.

## 2015-12-09 NOTE — Progress Notes (Signed)
LR infused 600cc.

## 2015-12-09 NOTE — Progress Notes (Signed)
Patient feels well, denies pain. Discharge instruction reviewed. Paper copy given to patient. To MAU exit via wheelchair accompanied by husband. To home with husband via car.

## 2016-01-30 ENCOUNTER — Encounter: Payer: Self-pay | Admitting: Neurology

## 2016-01-30 ENCOUNTER — Ambulatory Visit (INDEPENDENT_AMBULATORY_CARE_PROVIDER_SITE_OTHER): Payer: BC Managed Care – PPO | Admitting: Neurology

## 2016-01-30 VITALS — BP 122/82 | HR 80 | Resp 16 | Ht 61.0 in | Wt 154.0 lb

## 2016-01-30 DIAGNOSIS — R200 Anesthesia of skin: Secondary | ICD-10-CM

## 2016-01-30 DIAGNOSIS — M545 Low back pain: Secondary | ICD-10-CM | POA: Diagnosis not present

## 2016-01-30 DIAGNOSIS — M79605 Pain in left leg: Secondary | ICD-10-CM

## 2016-01-30 DIAGNOSIS — R202 Paresthesia of skin: Secondary | ICD-10-CM | POA: Diagnosis not present

## 2016-01-30 DIAGNOSIS — R42 Dizziness and giddiness: Secondary | ICD-10-CM | POA: Diagnosis not present

## 2016-01-30 DIAGNOSIS — G971 Other reaction to spinal and lumbar puncture: Secondary | ICD-10-CM | POA: Diagnosis not present

## 2016-01-30 NOTE — Progress Notes (Signed)
Subjective:    Patient ID: Stacey Proctor is a 40 y.o. female.  HPI     Stacey Foley, MD, PhD [facility] [address], [address] [address] [place], [place] [address]  Dear Dr. Billy Coast,   I saw your patient, Stacey Proctor, upon your kind request in my neurologic clinic today for initial consultation of her dizziness. The patient is unaccompanied today. As you know, [name] is a 40 year old right-handed woman with an underlying medical history of iron deficiency anemia, gestational diabetes, post spinal anesthesia headache, status post blood patch x 2, who reports intermittent dizziness for the past month or so. She has had intermittent facial numbness and tingling, all her symptoms started after her epidural on 10/25/15, she had a NVD.  She had a blood patch on 10/26/15, and the second blood batch on 12/08/15 and headaches are better since then. She started having ringing in her ears on 12/12/15. She started facial numbness in different areas of f the face, L or R or both. She had R sided body numbness on 12/24/15. Since then, she has had variable intermittent numbness and tingling in different parts of her body. Currently, she feels she has numbness and tingling in her left mid and lower face. She has some low back pain, sometimes radiating to the left. She feels sore in the lower back as well. She is currently breast-feeding. She has no sustained numbness, no vertigo, no weakness. She tries to drink enough water. She has had some recent sleep deprivation, her daughter also recently had a baby. I reviewed your office note from 01/10/2016.  She has been drinking more sodas, about 2-3 per day recently.   Of note, she presented to the emergency room on 12/05/2015 with a headache. Symptoms were suggestive of migraine but because of the sudden onset and severity of her headache she was not only symptomatically treated with a migraine cocktail but advised to undergo her lumbar puncture   for concern for subarachnoid hemorrhage. She declined a lumbar puncture. She had a head CT without contrast and CT  angiogram head with and without contrast on 12/06/2015. I reviewed the test results:  IMPRESSION: No acute intracranial abnormalities.  IMPRESSION: Negative intracranial CTA. Normal postcontrast CT appearance of the brain. In addition, I personally reviewed the images through the PACS system.  Her Past Medical History Is Significant For: Past Medical History  Diagnosis Date  . Medical history non-contributory   . Gestational diabetes mellitus, antepartum   . Herpes   . Abnormal Pap smear   . Postpartum care following vaginal delivery (1/12) 10/10/2013  . Vaginal Pap smear, abnormal   . Gestational diabetes   . Maternal iron deficiency anemia 10/26/2015  . Acute blood loss anemia 10/26/2015  . Epidural anesthesia-induced headache during puerperium 10/26/2015  . Shingles     Her Past Surgical History Is Significant For: Past Surgical History  Procedure Laterality Date  . Tubal ligation    . Tubal reversal  09/24/2012  . Wisdom tooth extraction    . Leep      Her Family History Is Significant For: Family History  Problem Relation Age of Onset  . Transient ischemic attack Mother   . Crohn's disease Mother   . Rheum arthritis Mother   . Seizures Mother   . Diabetes Father   . Heart disease Father   . Hypertension Father   . Heart attack Father   . Rheum arthritis Father   . Heart disease Maternal Grandmother   .  Diabetes Maternal Grandfather   . Diabetes Paternal Grandmother   . Cancer Paternal Grandfather   . Heart attack Paternal Grandfather   . Hypertension Paternal Grandfather   . Emphysema Paternal Grandfather   . Birth defects Son     polydactyl    Her Social History Is Significant For: Social History   Social History  . Marital Status: Married    Spouse Name: N/A  . Number of Children: 4  . Years of Education: Masters   Social History Main  Topics  . Smoking status: Never Smoker   . Smokeless tobacco: Former Neurosurgeon    Quit date: 09/23/2012  . Alcohol Use: No  . Drug Use: No  . Sexual Activity: Yes   Other Topics Concern  . None   Social History Narrative   Drinks 2-3 caffeine drinks a day     Her Allergies Are:  Allergies  Allergen Reactions  . Codeine Anaphylaxis    Pt says she can tolerate hydrocodone  . Penicillins Anaphylaxis    Has patient had a PCN reaction causing immediate rash, facial/tongue/throat swelling, SOB or lightheadedness with hypotension: Yes Has patient had a PCN reaction causing severe rash involving mucus membranes or skin necrosis: No Has patient had a PCN reaction that required hospitalization Yes Has patient had a PCN reaction occurring within the last 10 years: Yes If all of the above answers are "NO", then may proceed with Cephalosporin use.   . Sulfa Antibiotics Anaphylaxis  . Lyrica [Pregabalin] Other (See Comments)    hallucinations  :   Her Current Medications Are:  Outpatient Encounter Prescriptions as of 01/30/2016  Medication Sig  . iron polysaccharides (NIFEREX) 150 MG capsule Take 1 capsule (150 mg total) by mouth 2 (two) times daily.  . Prenatal Vit-Fe Fumarate-FA (PRENATAL MULTIVITAMIN) TABS Take 1 tablet by mouth daily.   . valACYclovir (VALTREX) 500 MG tablet Take 500 mg by mouth daily.   . [DISCONTINUED] cyclobenzaprine (FLEXERIL) 10 MG tablet Take 1 tablet (10 mg total) by mouth 3 (three) times daily as needed for muscle spasms.  . [DISCONTINUED] HYDROcodone-acetaminophen (NORCO) 5-325 MG tablet Take 1 tablet by mouth every 4 (four) hours as needed for moderate pain.  . [DISCONTINUED] magnesium oxide (MAG-OX) 400 (241.3 Mg) MG tablet Take 1 tablet (400 mg total) by mouth daily.  . [DISCONTINUED] prochlorperazine (COMPAZINE) 10 MG tablet Take 1 tablet (10 mg total) by mouth every 6 (six) hours as needed for nausea or vomiting (or headache).  . [DISCONTINUED] vitamin C  (VITAMIN C) 500 MG tablet Take 1 tablet (500 mg total) by mouth 2 (two) times daily.   No facility-administered encounter medications on file as of 01/30/2016.  : Review of Systems:  Out of a complete 14 point review of systems, all are reviewed and negative with the exception of these symptoms as listed below:   Review of Systems  HENT: Positive for tinnitus.   Eyes:       Reports flashes and floaters  Neurological: Positive for dizziness and numbness.       Patient reports that has had dizziness and numbness since 10/21/15 after Epideral. States that it comes and goes and location changes.     Objective:  Neurologic Exam  Physical Exam Physical Examination:   Filed Vitals:   01/30/16 1550 01/30/16 1555  BP: 125/79 122/82  Pulse: 74 80  Resp: 16 16   General Examination: The patient is a very pleasant 40 y.o. female in no acute distress. She appears  well-developed and well-nourished and well groomed. She is mildly anxious appearing. She denies feeling lightheaded or vertiginous upon standing. She had no significant orthostatic vital signs changes.  HEENT: Normocephalic, atraumatic, pupils are equal, round and reactive to light and accommodation. Funduscopic exam is normal with sharp disc margins noted. Extraocular tracking is good without limitation to gaze excursion or nystagmus noted. Normal smooth pursuit is noted. Hearing is grossly intact. Tympanic membranes are clear bilaterally. Face is symmetric with normal facial animation and normal facial sensation. Speech is clear with no dysarthria noted. There is no hypophonia. There is no lip, neck/head, jaw or voice tremor. Neck is supple with full range of passive and active motion. There are no carotid bruits on auscultation. Oropharynx exam reveals: mild mouth dryness, good dental hygiene and mild airway crowding, due to smaller airway entry. Mallampati is class II. Tongue protrudes centrally and palate elevates symmetrically.   Chest:  Clear to auscultation without wheezing, rhonchi or crackles noted.  Heart: S1+S2+0, regular and normal without murmurs, rubs or gallops noted.   Abdomen: Soft, non-tender and non-distended with normal bowel sounds appreciated on auscultation.  Extremities: There is no pitting edema in the distal lower extremities bilaterally. Pedal pulses are intact.  Skin: Warm and dry without trophic changes noted. There are no varicose veins.  Musculoskeletal: exam reveals no obvious joint deformities, tenderness or joint swelling or erythema.   Neurologically:  Mental status: The patient is awake, alert and oriented in all 4 spheres. Her immediate and remote memory, attention, language skills and fund of knowledge are appropriate. There is no evidence of aphasia, agnosia, apraxia or anomia. Speech is clear with normal prosody and enunciation. Thought process is linear. Mood is normal and affect is normal.  Cranial nerves II - XII are as described above under HEENT exam. In addition: shoulder shrug is normal with equal shoulder height noted. Motor exam: Normal bulk, strength and tone is noted. There is no drift, tremor or rebound. Romberg is negative. Reflexes are 2+ throughout. Babinski: Toes are flexor bilaterally. Fine motor skills and coordination: intact with normal finger taps, normal hand movements, normal rapid alternating patting, normal foot taps and normal foot agility.  Cerebellar testing: No dysmetria or intention tremor on finger to nose testing. Heel to shin is unremarkable bilaterally. There is no truncal or gait ataxia.  Sensory exam: intact to light touch, pinprick, vibration, temperature sense in the upper and lower extremities.  Gait, station and balance: She stands with no difficulty. No veering to one side is noted. No leaning to one side is noted. Posture is age-appropriate and stance is narrow based. Gait shows normal stride length and normal pace. No problems turning are noted. She turns  en bloc. Tandem walk is unremarkable.   Assessment and Plan:   In summary, Celso SickleJudy Gosney is a very pleasant 40 y.o.-year old female with an underlying medical history of iron deficiency anemia, gestational diabetes, post spinal anesthesia headache, status post blood patch x 2, who reports intermittent dizziness for the past month or so. She has had intermittent facial numbness and tingling, with symptoms starting after her epidural in late January 2017. Thankfully, her exam is nonfocal. I'm not sure how to explain her symptoms. Her headaches have improved after her second blood patch. She had recent head CT and CTA head which I reviewed. Thankfully with normal findings. I suggested we proceed for completion with a brain MRI with and without contrast and lumbar spine MRI wo contrast. We will call her  with her test results. This could still be some residual symptoms from low spinal fluid pressure. I suggested she stay well-hydrated and gradually taper down her caffeine intake if possible. I will see her back routinely in a couple of months, sooner if needed and as warranted based on her test results. I answered all her questions today and the patient was in agreement with the above outlined plan. Thank you very much for allowing me to participate in the care of this nice patient. If I can be of any further assistance to you please do not hesitate to call me at [phone].  Sincerely,   Stacey Foley, MD, PhD

## 2016-01-30 NOTE — Patient Instructions (Signed)
Your exam is non-focal which is reassuring.  Nevertheless, let's check a couple of things: We will do a brain scan, called MRI and call you with the test results. We will have to schedule you for this on a separate date. This test requires authorization from your insurance, and we will take care of the insurance process. We will do a lumbar spine (i.e. Low back) MRI. Your symptoms may very well improve with time.

## 2016-02-13 ENCOUNTER — Ambulatory Visit (INDEPENDENT_AMBULATORY_CARE_PROVIDER_SITE_OTHER): Payer: BC Managed Care – PPO

## 2016-02-13 DIAGNOSIS — R42 Dizziness and giddiness: Secondary | ICD-10-CM

## 2016-02-13 DIAGNOSIS — R202 Paresthesia of skin: Secondary | ICD-10-CM | POA: Diagnosis not present

## 2016-02-13 DIAGNOSIS — G971 Other reaction to spinal and lumbar puncture: Secondary | ICD-10-CM

## 2016-02-13 DIAGNOSIS — R200 Anesthesia of skin: Secondary | ICD-10-CM

## 2016-02-13 DIAGNOSIS — M545 Low back pain, unspecified: Secondary | ICD-10-CM

## 2016-02-13 DIAGNOSIS — M79605 Pain in left leg: Secondary | ICD-10-CM

## 2016-02-15 ENCOUNTER — Telehealth: Payer: Self-pay

## 2016-02-15 NOTE — Telephone Encounter (Signed)
No further testing needed from my end. Disc bulge comes with time, not epidural. No surgical disease. Blood patch does not show up on MRI. Agree with GI consult and would recommend FU with PCP after that.

## 2016-02-15 NOTE — Progress Notes (Signed)
Quick Note:  Please call and advise the patient that the recent scans we did. We did a brain MRI and L spine MRI, which showed normal findings in the brain scan and Mild disc bulge at L4-5. In particular, there were no acute findings, such as a stroke, or mass or blood products, no herniated disk. No further action is required on these tests at this time. Please remind patient to keep any upcoming appointments or tests and to call us with any interim questions, concerns, problems or updates. Thanks,  Huston FoleySaima Betti Goodenow, MD, PhD   ______

## 2016-02-15 NOTE — Telephone Encounter (Signed)
-----   Message from Huston FoleySaima Athar, MD sent at 02/15/2016  9:39 AM EDT ----- Please call and advise the patient that the recent scans we did. We did a brain MRI and L spine MRI, which showed normal findings in the brain scan and Mild disc bulge at L4-5. In particular, there were no acute findings, such as a stroke, or mass or blood products, no herniated disk. No further action is required on these tests at this time. Please remind patient to keep any upcoming appointments or tests and to call us with any interim questions, concerns, problems or updates. Thanks,  Huston FoleySaima Athar, MD, PhD

## 2016-02-15 NOTE — Telephone Encounter (Signed)
I spoke to patient and gave her results below.   She asks.... Do the MRIs show any nerve damage? Do you think she should have MRI with contrast?  Is there any evidence of blood patches if so where were they?  Is there anything to be done about disc bulge and could that have come from epidural?  What are the nest steps to find out why different parts of her body go numb?   She is rescheduling her GI consult for bowl incontinence. Patient is aware that you will be out of town and that I will call her back as soon as I get an answer.

## 2016-02-18 NOTE — Telephone Encounter (Signed)
I LM on VM (per DPR) with message below.

## 2016-03-05 ENCOUNTER — Telehealth: Payer: Self-pay | Admitting: Neurology

## 2016-03-05 NOTE — Telephone Encounter (Signed)
Andrea/[facility] [phone] called to request records on this patient that was referred from their office. Fax# [phone]. Is aware Debra/Medical Records is out of the office until tomorrow.

## 2016-03-07 NOTE — Telephone Encounter (Signed)
Records faxed to OB-GYN to attn Sue LushAndrea.

## 2016-05-01 ENCOUNTER — Ambulatory Visit: Payer: BC Managed Care – PPO | Admitting: Neurology

## 2016-07-16 ENCOUNTER — Emergency Department (HOSPITAL_COMMUNITY)
Admission: EM | Admit: 2016-07-16 | Discharge: 2016-07-17 | Disposition: A | Discharge status: Home / Self Care | Payer: BC Managed Care – PPO | Attending: Emergency Medicine | Admitting: Emergency Medicine

## 2016-07-16 ENCOUNTER — Encounter (HOSPITAL_COMMUNITY): Payer: Self-pay

## 2016-07-16 DIAGNOSIS — Z7982 Long term (current) use of aspirin: Secondary | ICD-10-CM | POA: Insufficient documentation

## 2016-07-16 DIAGNOSIS — R202 Paresthesia of skin: Secondary | ICD-10-CM | POA: Diagnosis not present

## 2016-07-16 LAB — TSH: TSH: 2.963 u[IU]/mL (ref 0.350–4.500)

## 2016-07-16 LAB — CBC
HCT: 41.5 % (ref 36.0–46.0)
Hemoglobin: 13.8 g/dL (ref 12.0–15.0)
MCH: 29.1 pg (ref 26.0–34.0)
MCHC: 33.3 g/dL (ref 30.0–36.0)
MCV: 87.4 fL (ref 78.0–100.0)
PLATELETS: 153 10*3/uL (ref 150–400)
RBC: 4.75 MIL/uL (ref 3.87–5.11)
RDW: 13.7 % (ref 11.5–15.5)
WBC: 5.6 10*3/uL (ref 4.0–10.5)

## 2016-07-16 LAB — BASIC METABOLIC PANEL
Anion gap: 9 (ref 5–15)
BUN: 7 mg/dL (ref 6–20)
CALCIUM: 9.6 mg/dL (ref 8.9–10.3)
CO2: 23 mmol/L (ref 22–32)
CREATININE: 0.73 mg/dL (ref 0.44–1.00)
Chloride: 109 mmol/L (ref 101–111)
GFR calc Af Amer: >60 mL/min (ref >60)
GLUCOSE: 93 mg/dL (ref 65–99)
POTASSIUM: 4.2 mmol/L (ref 3.5–5.1)
SODIUM: 141 mmol/L (ref 135–145)

## 2016-07-16 LAB — T4, FREE: FREE T4: 0.8 ng/dL (ref 0.61–1.12)

## 2016-07-16 LAB — I-STAT BETA HCG BLOOD, ED (MC, WL, AP ONLY)

## 2016-07-16 NOTE — ED Triage Notes (Signed)
Patient here with numbness to various parts of her body since January. [facility] and MRI of brain was negative. This week has had increased left arm numbness with increased eye twitching. Alert and oriented, was told to come to ED for possible MRI of neck. NAD

## 2016-07-16 NOTE — ED Notes (Signed)
PA at the bedside.

## 2016-07-16 NOTE — ED Notes (Signed)
Dr. Linwood DibblesJon Knapp at bedside at this time.

## 2016-07-16 NOTE — ED Notes (Signed)
Pt stated that the rt side of her tongue is now burning and has now gone numb. Also, pt's left knee and left shoulder have begun to twitch and that has not happened before.

## 2016-07-16 NOTE — ED Provider Notes (Signed)
[facility]-EMERGENCY DEPT Provider Note   CSN: [id number] Arrival date & time: 07/16/16  1345     History   Chief Complaint No chief complaint on file.   HPI Kenishia Plack is a 40 y.o. female.  Neelam Tiggs is a 40 y.o. female h/o shingles on valcyclovir, presents to the ED with left upper extremity paresthesias, weakness, and "descreased sensation of swallowing." She has experienced intermittent, random areas of paresthesias on the left side of her body since 09/2015, following a spinal epidural for childbirth. She has had associated eye twitching, blurry vision, loss of bowel/bladder control, head pressure, and tinnitus. She states sxs come and go, affect different/random areas of her body, and do not seem to be influenced by stress or exertion. Symptoms are not sustained. She is being followed by Dr. Baldo Daub of [facility] neurology. MRI of brain and lumbar spine completed on 06/30/16 show no acute abnormalities. Question of possible pathology in cervical/thoracic spine and recommending f/u MRI. She is currently on gabapentin and B12 supplementation. Patient comes today to ED for left upper extremity paresthesias, increased eye twitching, and "decreased sensation of swallowing." She was at school when symptoms started this afternoon, she was given ASA, her neurologist was called and recommended patient go to ED.  Patient is able to swallow; however, states "I can't feel myself swallowing." She is not drooling. She is managing her oral secretions. She reported "tinglining/burning" sensation to her tongue. She denies fever, chest pain, SOB, abdominal pain, N/V, neck pain, back pain, dysuria, hematuria, LOC, facial droop, slurred speech.       Past Medical History:  Diagnosis Date  . Abnormal Pap smear   . Acute blood loss anemia 10/26/2015  . Epidural anesthesia-induced headache during puerperium 10/26/2015  . Gestational diabetes   . Gestational diabetes mellitus, antepartum   . Herpes   . Maternal iron  deficiency anemia 10/26/2015  . Medical history non-contributory   . Postpartum care following vaginal delivery (1/12) 10/10/2013  . Shingles   . Vaginal Pap smear, abnormal     Patient Active Problem List   Diagnosis Date Noted  . Maternal iron deficiency anemia 10/26/2015  . Acute blood loss anemia 10/26/2015  . Epidural anesthesia-induced headache during puerperium 10/26/2015  . Diabetes in undelivered pregnancy 10/25/2015  . Postpartum care following vaginal delivery (1/26) 10/25/2015  . Normal pregnancy 10/09/2013  . GDM, class A2 - induction 10/09/2013    Past Surgical History:  Procedure Laterality Date  . LEEP    . TUBAL LIGATION    . tubal reversal  09/24/2012  . WISDOM TOOTH EXTRACTION      OB History    Gravida Para Term Preterm AB Living   5 4 4  0 1 4   SAB TAB Ectopic Multiple Live Births   1 0 0 0 3       Home Medications    Prior to Admission medications   Medication Sig Start Date End Date Taking? Authorizing Provider  aspirin 81 MG chewable tablet Chew 81 mg by mouth daily as needed for mild pain.   Yes Historical Provider, MD  gabapentin (NEURONTIN) 300 MG capsule Take 300 mg by mouth 3 (three) times daily.   Yes Historical Provider, MD  MedroxyPROGESTERone Acetate (DEPO-PROVERA IM) Inject into the muscle. Every three months. Patient states she is due sometime in November. Unknown strength   Yes Historical Provider, MD  Multiple Vitamin (MULTIVITAMIN WITH MINERALS) TABS tablet Take 1 tablet by mouth daily.   Yes Historical Provider, MD  Omega-3 Fatty Acids (FISH OIL) 1000 MG CAPS Take 1 capsule by mouth 3 (three) times daily.   Yes Historical Provider, MD  Probiotic Product (PROBIOTIC PO) Take 1 tablet by mouth daily.   Yes Historical Provider, MD  valACYclovir (VALTREX) 500 MG tablet Take 500 mg by mouth daily. For cold sores   Yes Historical Provider, MD  iron polysaccharides (NIFEREX) 150 MG capsule Take 1 capsule (150 mg total) by mouth 2 (two) times  daily. Patient not taking: Reported on 07/16/2016 10/29/15   Raelyn Mora, CNM    Family History Family History  Problem Relation Age of Onset  . Diabetes Father   . Heart disease Father   . Hypertension Father   . Heart attack Father   . Rheum arthritis Father   . Transient ischemic attack Mother   . Crohn's disease Mother   . Rheum arthritis Mother   . Seizures Mother   . Heart disease Maternal Grandmother   . Diabetes Maternal Grandfather   . Diabetes Paternal Grandmother   . Cancer Paternal Grandfather   . Heart attack Paternal Grandfather   . Hypertension Paternal Grandfather   . Emphysema Paternal Grandfather   . Birth defects Son     polydactyl    Social History Social History  Substance Use Topics  . Smoking status: Never Smoker  . Smokeless tobacco: Former Neurosurgeon    Quit date: 09/23/2012  . Alcohol use No     Allergies   Codeine; Penicillins; Sulfa antibiotics; and Lyrica [pregabalin]   Review of Systems Review of Systems  Constitutional: Negative for fever.  HENT: Positive for tinnitus. Negative for drooling and trouble swallowing.        Decreased "sensation of swallowing"  Eyes: Positive for visual disturbance ( intermittent blurry).       Eye twitching  Respiratory: Negative for choking and shortness of breath.   Cardiovascular: Negative for chest pain.  Gastrointestinal: Negative for abdominal pain, blood in stool, constipation, diarrhea, nausea and vomiting.  Genitourinary: Negative for dysuria and hematuria.  Musculoskeletal: Negative for back pain and neck pain.  Skin: Positive for rash (arm, chronic).  Neurological: Positive for weakness, numbness and headaches ( described as pressure). Negative for syncope, facial asymmetry and speech difficulty.     Physical Exam Updated Vital Signs BP 119/74   Pulse 86   Temp 98.1 F (36.7 C) (Oral)   Resp 15   SpO2 99%   Physical Exam  Constitutional: She appears well-developed and  well-nourished. No distress.  HENT:  Head: Normocephalic and atraumatic.  Right Ear: Tympanic membrane, external ear and ear canal normal.  Left Ear: External ear and ear canal normal. A middle ear effusion is present.  Mouth/Throat: Uvula is midline, oropharynx is clear and moist and mucous membranes are normal. No trismus in the jaw. No oropharyngeal exudate. No tonsillar exudate.  Uvula midline. No trismus. Tongue midline.   Eyes: Conjunctivae and EOM are normal. Pupils are equal, round, and reactive to light. Right eye exhibits no discharge. Left eye exhibits no discharge. No scleral icterus.  Neck: Normal range of motion and phonation normal. Neck supple. No neck rigidity. Normal range of motion present.  No nuchal rigidity.   Cardiovascular: Normal rate, regular rhythm, normal heart sounds and intact distal pulses.   No murmur heard. Pulmonary/Chest: Effort normal and breath sounds normal. No stridor. No respiratory distress. She has no wheezes. She has no rales.  Abdominal: Soft. Bowel sounds are normal. She exhibits no distension. There is  no tenderness. There is no rigidity, no rebound, no guarding and no CVA tenderness.  Musculoskeletal: Normal range of motion.  Lymphadenopathy:    She has no cervical adenopathy.  Neurological: She is alert. She is not disoriented. She exhibits normal muscle tone. Coordination and gait normal. GCS eye subscore is 4. GCS verbal subscore is 5. GCS motor subscore is 6.  Mental Status:  Alert, thought content appropriate, able to give a coherent history. Speech fluent without evidence of aphasia. Able to follow 2 step commands without difficulty.  Cranial Nerves:  II:  pupils equal, round, reactive to light III,IV, VI: ptosis not present, extra-ocular motions intact bilaterally  V,VII: smile symmetric, facial light touch sensation equal VIII: hearing grossly normal to voice  X: uvula elevates symmetrically  XI: bilateral shoulder shrug symmetric and  strong XII: midline tongue extension without fassiculations Motor:  Normal tone. Slight decrease in strength with pushing motion in LUE, 5/5 lower extremity b/l. Equal grip strength and dorsiflexion/plantar flexion Sensory: decrease light touch sensation in LUE compared to RUE. Cerebellar: normal finger-to-nose with bilateral upper extremities Gait: ambulatory CV: distal pulses palpable throughout  No pronator drift.   Skin: Skin is warm and dry. She is not diaphoretic.  Psychiatric: She has a normal mood and affect. Her behavior is normal.     ED Treatments / Results  Labs (all labs ordered are listed, but only abnormal results are displayed) Labs Reviewed  BASIC METABOLIC PANEL  CBC  TSH  T4, FREE  I-STAT BETA HCG BLOOD, ED ([facility], [facility], [facility] ONLY)    EKG  EKG Interpretation None       Radiology No results found.  Procedures Procedures (including critical care time)  Medications Ordered in ED Medications  gadobenate dimeglumine (MULTIHANCE) injection 15 mL (15 mLs Intravenous Contrast Given 07/17/16 0230)     Initial Impression / Assessment and Plan / ED Course  I have reviewed the triage vital signs and the nursing notes.  Pertinent labs & imaging results that were available during my care of the patient were reviewed by me and considered in my medical decision making (see chart for details).  Clinical Course  Value Comment By Time  Chloride: 109 (Reviewed) Linwood DibblesJon Knapp, MD 10/18 [phone] [id number]    Patient presents to ED with complaint of left arm paresthesias and "decrease sensation swallowing." Intermittent paresthesias since 09/2015. Follow[facility]. MRI brain/lumbar spine on 06/30/16 showed no acute abnormalities. Patient is afebrile and non-toxic appearing in NAD. VSS. Patient is reclining in bed. She is managing her oral secretions. No drooling.  Decrease sensation in LUE compared to right. Slight decrease pushing strength in LUE. CN II-XII grossly  intact. Nml finger-to-nose. No pronator drift. Patient also underwent CT and CTA head on 12/06/15 showing no acute abnormalities. Will check basic labs.    Labs are re-assuring - not pregnant, thyroid tests nml, no electrolyte abnormalities, no anemia. Patient drank water and ate a piece of graham cracker in ED without choking. She has ambulated. Unclear etiology of sxs - ?cervical/thoracic pathology - per neurology notes some concern. Will order MRI cervical/thoracic spine as planned by her neurologist.   At shift change, patient care signed out to [facility], PA-C. Pending normal MRI patient to d/c with follow up to neurology and PCP.   Final Clinical Impressions(s) / ED Diagnoses   Final diagnoses:  Paresthesias    New Prescriptions New Prescriptions   No medications on file     [facility]  Daphane Shepherd, PA-C 07/17/16 0247    Linwood Dibbles, MD 07/17/16 [phone]

## 2016-07-16 NOTE — ED Notes (Signed)
Madlyn FrankelKevin Rezek (husband) called, would like for PT or nurse to give him a call when it is close to time of discharge. PT husband lives 45 mins away with small children and needs enough time to get here. -TY   Phone number: [phone]

## 2016-07-16 NOTE — ED Notes (Signed)
Ashley, PA at bedside at this time.  

## 2016-07-17 ENCOUNTER — Emergency Department (HOSPITAL_COMMUNITY): Payer: BC Managed Care – PPO

## 2016-07-17 MED ORDER — GADOBENATE DIMEGLUMINE 529 MG/ML IV SOLN
15.0000 mL | Freq: Once | INTRAVENOUS | Status: AC | PRN
  Administered 2016-07-17: 15 mL via INTRAVENOUS

## 2016-07-17 MED ORDER — ACETAMINOPHEN 325 MG PO TABS
650.0000 mg | ORAL_TABLET | Freq: Once | ORAL | Status: AC
  Administered 2016-07-17: 650 mg via ORAL
  Filled 2016-07-17: qty 2

## 2016-07-17 NOTE — ED Provider Notes (Signed)
H/o intermittent paresthesias of left side since epidural in Jan 2017 - [facility]. MRI brain, lumbar spine - unremarkable. Today feels like swallowing is "numb" but she is managing solids and liquids. Increased eye twitching.   Pending MRI cervical spine. She is swallowing in ED, ambulation normal.   Plan: MRI normal, can be discharged [facility].  Cervical/thoracic MR with no acute finding. Patient updated on results. She can be discharged home per previous treatment team.    Stacey AnisShari Kandon Hosking, PA-C 07/17/16 0703    Linwood DibblesJon Knapp, MD 07/17/16 2303

## 2016-07-17 NOTE — ED Notes (Signed)
Patient currently in MRI.

## 2016-07-17 NOTE — Discharge Instructions (Signed)
Read the information below.  Your labs are re-assuring. Your imaging is re-assuring.  It is very important that you follow up with your neurologist, please call this morning to schedule an appointment.  Continue medication regimen as prescribed by your neurologist.  You may return to the Emergency Department at any time for worsening condition or any new symptoms that concern you.

## 2016-07-17 NOTE — ED Notes (Signed)
Spouse called by patient to come to ED to transport patient to home after she is discharged.

## 2016-07-17 NOTE — ED Notes (Signed)
Patient still in MRI at this time

## 2016-07-17 NOTE — ED Notes (Signed)
RN observed patient drink water with straw from cup, and took two Tylenol tablets PO successfully. Patient very cautious, but no difficulty noted in swallowing.

## 2017-12-08 ENCOUNTER — Encounter: Payer: Self-pay | Admitting: Neurology

## 2017-12-08 ENCOUNTER — Ambulatory Visit (INDEPENDENT_AMBULATORY_CARE_PROVIDER_SITE_OTHER): Payer: BC Managed Care – PPO | Admitting: Neurology

## 2017-12-08 VITALS — BP 137/85 | HR 85 | Ht 61.0 in | Wt 158.0 lb

## 2017-12-08 DIAGNOSIS — Z9119 Patient's noncompliance with other medical treatment and regimen: Secondary | ICD-10-CM

## 2017-12-08 NOTE — Progress Notes (Signed)
Subjective:    Patient ID: Stacey Proctor is a 42 y.o. female.  HPI   Dear Stacey Proctor,   I saw your patient, Stacey Proctor, upon your kind request in my neurologic clinic today for initial consultation of her tremors. The patient is unaccompanied today. As you know, Stacey Proctor is a 42 year old right-handed woman with an underlying medical history of iron deficiency anemia, history of gestational diabetes, history of bladder or bowel incontinence, lumbar radiculopathy, history of kidney stones, and overweight state, who reports a history of recurrent falls, cognitive issues, concern about lupus, concern about multiple sclerosis, tremors, fine motor dyscontrol, passing out. [facility] has [facility]. [facility]. I reviewed the office notes [facility] and [facility] on [facility], but a office note was not available for my review. I reviewed with her during our conversation that she has had multiple tests done and has seen multiple neurologists at different places. She reports that she no longer goes to [facility] because she was told that she needed to see a psychiatrist and she was told to be admitted to behavioral health from what I understand. She did not go back after that. I explained to her that there may not be additional testing or diagnostic clarification that I may be able to offer her at this point, but I did offer her an exam and evaluation of her tremors. She has had MRI of the entire spine and MRI of the brain. Before I had a chance to examine her she requested to finish the appointment and requested a refund of her co-pay. She reported that she had requested to see a different provider in our office. I explained to her that while we can certainly request that provider it would be up to the other MD's discretion to review her chart and approve of the visit if deemed appropriate.  Review of Systems   Neurological:       Pt presents today to discuss multiple concerns. Pt reports that she cannot get out of bed if she is not taking prednisone. Pt has fallen 9 times recently.    Objective:  Neurological Exam  Physical Exam Not done Assessment and Plan:  Deferred. Patient requested to abort appointment and receive a refund.  I will not bill for my time.

## 2019-11-26 ENCOUNTER — Ambulatory Visit: Payer: BC Managed Care – PPO

## 2020-10-26 ENCOUNTER — Other Ambulatory Visit: Payer: Self-pay

## 2020-10-26 ENCOUNTER — Other Ambulatory Visit: Payer: Self-pay | Admitting: Family

## 2020-10-26 ENCOUNTER — Ambulatory Visit
Admission: RE | Admit: 2020-10-26 | Discharge: 2020-10-26 | Disposition: A | Discharge status: Home / Self Care | Payer: BC Managed Care – PPO | Source: Ambulatory Visit | Attending: Family | Admitting: Family

## 2020-10-26 DIAGNOSIS — R0602 Shortness of breath: Secondary | ICD-10-CM

## 2020-10-26 DIAGNOSIS — R0902 Hypoxemia: Secondary | ICD-10-CM

## 2020-10-26 MED ORDER — IOPAMIDOL (ISOVUE-370) INJECTION 76%: 75.0000 mL | Freq: Once | INTRAVENOUS | Status: DC | PRN

## 2020-11-15 ENCOUNTER — Ambulatory Visit
Admission: RE | Admit: 2020-11-15 | Discharge: 2020-11-15 | Disposition: A | Discharge status: Home / Self Care | Payer: BC Managed Care – PPO | Source: Ambulatory Visit | Attending: Nurse Practitioner | Admitting: Nurse Practitioner

## 2020-11-15 ENCOUNTER — Other Ambulatory Visit: Payer: Self-pay | Admitting: Nurse Practitioner

## 2020-11-15 DIAGNOSIS — R059 Cough, unspecified: Secondary | ICD-10-CM

## 2021-08-13 IMAGING — DX DG CHEST 2V
2 series · 2 of 2 positions shown · non-contrast
Comparison: 10/24/2020

CLINICAL DATA: 44-year-old female with a history of cough

EXAM:
CHEST - 2 VIEW

[dg chest 2 view (1 of 2)]
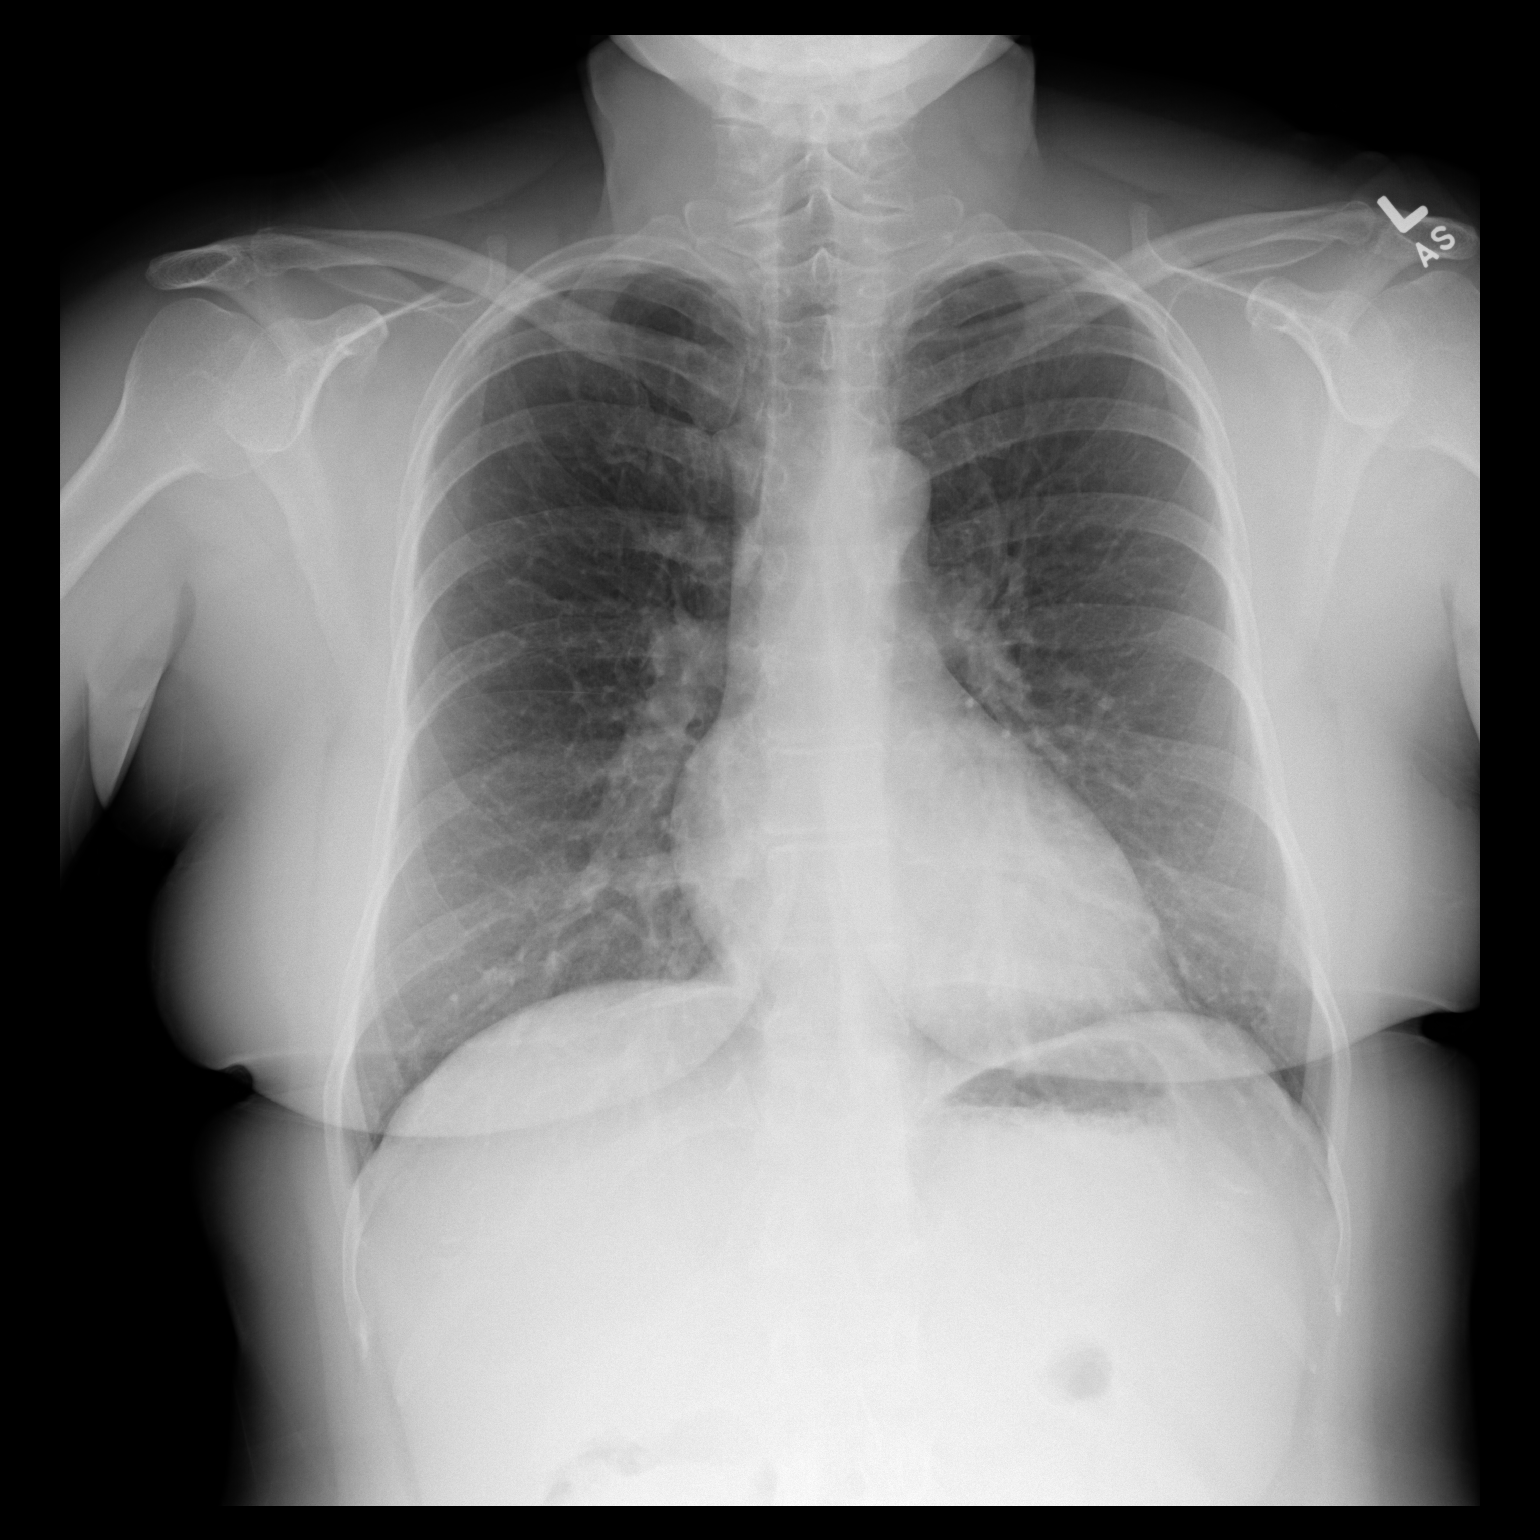

[dg chest 2 view (2 of 2)]
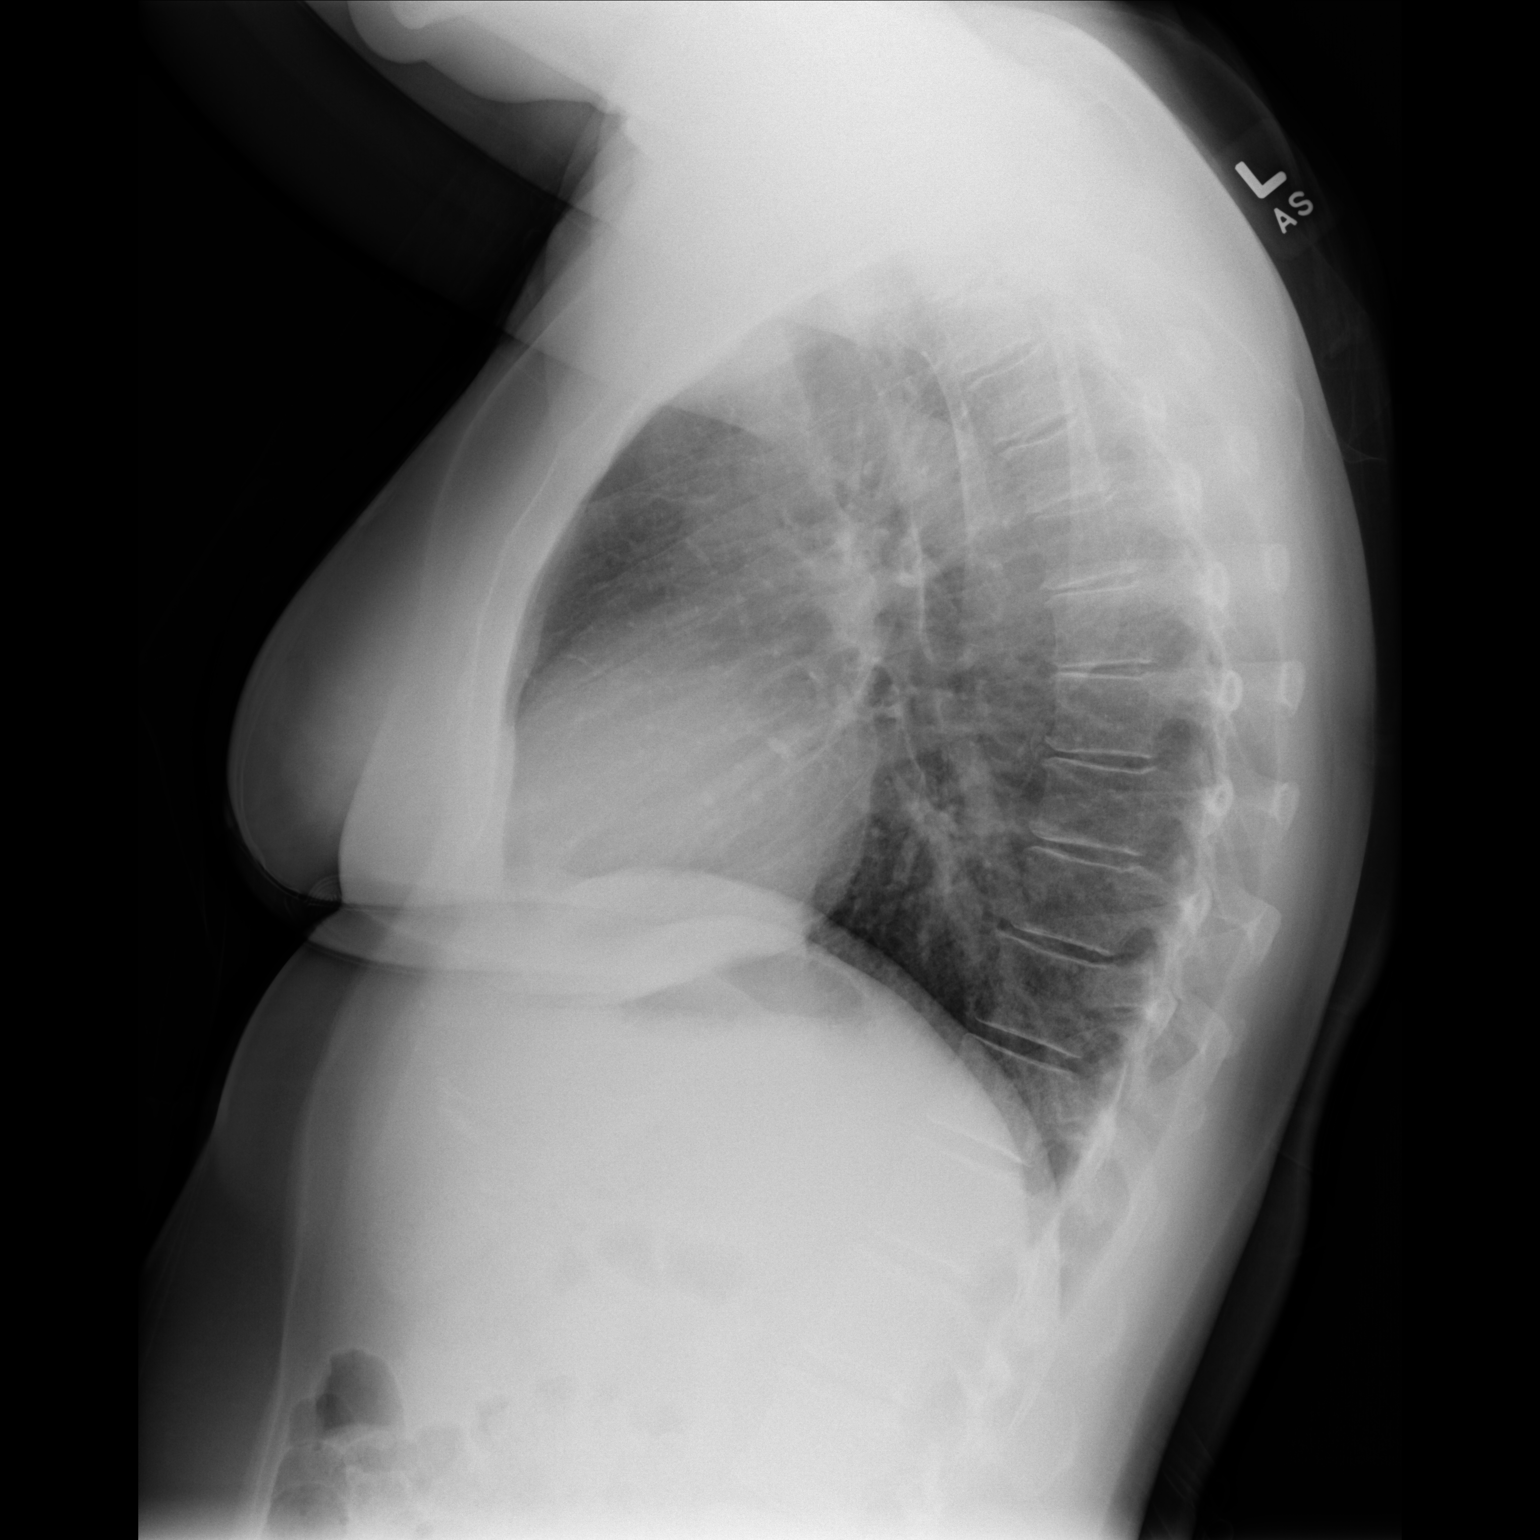

[2 of 2 positions shown; findings below may reference images not displayed]

FINDINGS: Cardiomediastinal silhouette within normal limits in size and
contour. No pneumothorax or pleural effusion.

Coarsened interstitial markings at the lung bases have improved from
the comparison. No new confluent airspace disease. No displaced
fracture.
IMPRESSION: Improving appearance of the chest x-ray with no evidence of acute
cardiopulmonary disease.
